# Patient Record
Sex: Female | Born: 1975 | Race: White | Hispanic: No | Marital: Single | Smoking: Former smoker
Health system: Southern US, Community
[De-identification: ages and names within clinical notes are randomized; demographics above are authoritative.]

## PROBLEM LIST (undated history)

## (undated) DIAGNOSIS — E669 Obesity, unspecified: Secondary | ICD-10-CM

## (undated) DIAGNOSIS — E785 Hyperlipidemia, unspecified: Secondary | ICD-10-CM

## (undated) DIAGNOSIS — Z8619 Personal history of other infectious and parasitic diseases: Secondary | ICD-10-CM

## (undated) HISTORY — DX: Obesity, unspecified: E66.9

## (undated) HISTORY — DX: Hyperlipidemia, unspecified: E78.5

## (undated) HISTORY — DX: Personal history of other infectious and parasitic diseases: Z86.19

---

## 2012-04-04 ENCOUNTER — Encounter: Payer: Self-pay | Admitting: Sports Medicine

## 2012-04-04 ENCOUNTER — Ambulatory Visit (INDEPENDENT_AMBULATORY_CARE_PROVIDER_SITE_OTHER): Payer: BC Managed Care – PPO | Admitting: Sports Medicine

## 2012-04-04 VITALS — BP 131/88 | HR 96 | Wt 222.0 lb

## 2012-04-04 DIAGNOSIS — E669 Obesity, unspecified: Secondary | ICD-10-CM

## 2012-04-04 DIAGNOSIS — Z299 Encounter for prophylactic measures, unspecified: Secondary | ICD-10-CM

## 2012-04-04 DIAGNOSIS — N979 Female infertility, unspecified: Secondary | ICD-10-CM | POA: Insufficient documentation

## 2012-04-04 DIAGNOSIS — Z3009 Encounter for other general counseling and advice on contraception: Secondary | ICD-10-CM

## 2012-04-04 DIAGNOSIS — Z23 Encounter for immunization: Secondary | ICD-10-CM

## 2012-04-04 DIAGNOSIS — Z0000 Encounter for general adult medical examination without abnormal findings: Secondary | ICD-10-CM | POA: Insufficient documentation

## 2012-04-04 MED ORDER — PHENTERMINE HCL 37.5 MG PO CAPS: 37.5000 mg | ORAL_CAPSULE | ORAL | Status: DC

## 2012-04-04 NOTE — Progress Notes (Signed)
Subjective:    CC: Establish care.   HPI:  Obesity: Has tried multiple methods of weight loss including dieting, and exercise. None of them worked, and she desires pharmacologic assistance.  Preventive measures: Needs some blood work done, Pap smears are done by her OB/GYN.  Trying to get pregnant: Has been trying for 2 years, but has not used the rhythm method.  Intercourse has been random.  Past medical history, Surgical history, Family history, Social history, Allergies, and medications have been entered into the medical record, reviewed, and no changes needed.   Review of Systems: No headache, visual changes, nausea, vomiting, diarrhea, constipation, dizziness, abdominal pain, skin rash, fevers, chills, night sweats, swollen lymph nodes, weight loss, chest pain, body aches, joint swelling, muscle aches, shortness of breath, mood changes, visual or auditory hallucinations.  Objective:    General: Well Developed, well nourished, and in no acute distress.  Neuro: Alert and oriented x3, extra-ocular muscles intact.  HEENT: Normocephalic, atraumatic, pupils equal round reactive to light, neck supple, no masses, no lymphadenopathy, thyroid nonpalpable.  Skin: Warm and dry, no rashes noted.  Cardiac: Regular rate and rhythm, no murmurs rubs or gallops.  Respiratory: Clear to auscultation bilaterally. Not using accessory muscles, speaking in full sentences.  Abdominal: Soft, nontender, nondistended, positive bowel sounds, no masses, no organomegaly.  Musculoskeletal: Shoulder, elbow, wrist, hip, knee, ankle stable, and with full range of motion.  Impression and Recommendations:    The patient was counselled, risk factors were discussed, anticipatory guidance given.

## 2012-04-04 NOTE — Assessment & Plan Note (Signed)
Discussed rhythm method, as well as some of the physiology of fertility. Her husband will try for one year, and if ineffective we can consider referral to fertility medicine.

## 2012-04-04 NOTE — Assessment & Plan Note (Signed)
Has failed dieting and exercising. We'll start phentermine, and the nutritionist referral. She will return monthly for weight checks and refills.

## 2012-04-04 NOTE — Assessment & Plan Note (Signed)
Pap smears are done by an OB/GYN.  checking routine blood work. Return to clinic on an as-needed basis.

## 2012-04-06 LAB — COMPREHENSIVE METABOLIC PANEL
Alkaline Phosphatase: 60 U/L (ref 39–117)
Creat: 0.85 mg/dL (ref 0.50–1.10)
Glucose, Bld: 77 mg/dL (ref 70–99)
Sodium: 139 mEq/L (ref 135–145)
Total Bilirubin: 0.7 mg/dL (ref 0.3–1.2)
Total Protein: 7.1 g/dL (ref 6.0–8.3)

## 2012-04-06 LAB — LIPID PANEL
Cholesterol: 251 mg/dL — ABNORMAL HIGH (ref 0–200)
HDL: 64 mg/dL (ref >39)
LDL Cholesterol: 165 mg/dL — ABNORMAL HIGH (ref 0–99)
Total CHOL/HDL Ratio: 3.9 Ratio
Triglycerides: 109 mg/dL (ref <150)
VLDL: 22 mg/dL (ref 0–40)

## 2012-04-06 LAB — CBC
HCT: 46.6 % — ABNORMAL HIGH (ref 36.0–46.0)
Hemoglobin: 16 g/dL — ABNORMAL HIGH (ref 12.0–15.0)
MCH: 32.2 pg (ref 26.0–34.0)
MCHC: 34.3 g/dL (ref 30.0–36.0)
MCV: 93.8 fL (ref 78.0–100.0)
Platelets: 239 10*3/uL (ref 150–400)
RBC: 4.97 MIL/uL (ref 3.87–5.11)
RDW: 13.6 % (ref 11.5–15.5)
WBC: 6.8 K/uL (ref 4.0–10.5)

## 2012-04-06 LAB — TSH: TSH: 1.379 u[IU]/mL (ref 0.350–4.500)

## 2012-04-06 LAB — COMPREHENSIVE METABOLIC PANEL WITH GFR
ALT: 20 U/L (ref 0–35)
AST: 23 U/L (ref 0–37)
Albumin: 4.7 g/dL (ref 3.5–5.2)
BUN: 14 mg/dL (ref 6–23)
CO2: 23 meq/L (ref 19–32)
Calcium: 9.5 mg/dL (ref 8.4–10.5)
Chloride: 105 meq/L (ref 96–112)
Potassium: 4.6 meq/L (ref 3.5–5.3)

## 2012-04-06 LAB — TESTOSTERONE, FREE, TOTAL, SHBG
Sex Hormone Binding: 46 nmol/L (ref 18–114)
Testosterone, Free: 5 pg/mL (ref 0.6–6.8)
Testosterone-% Free: 1.5 % (ref 0.4–2.4)
Testosterone: 34.23 ng/dL (ref 10–70)

## 2012-04-06 LAB — VITAMIN D 25 HYDROXY (VIT D DEFICIENCY, FRACTURES): Vit D, 25-Hydroxy: 37 ng/mL (ref 30–89)

## 2012-04-06 MED ORDER — ATORVASTATIN CALCIUM 40 MG PO TABS: 40.0000 mg | ORAL_TABLET | Freq: Every day | ORAL | Status: DC

## 2012-04-06 NOTE — Addendum Note (Signed)
Addended by: Monica Becton on: 04/06/2012 12:43 PM   Modules accepted: Orders

## 2012-04-12 ENCOUNTER — Encounter: Payer: BC Managed Care – PPO | Attending: Sports Medicine | Admitting: *Deleted

## 2012-04-12 ENCOUNTER — Encounter: Payer: Self-pay | Admitting: *Deleted

## 2012-04-12 VITALS — Ht 66.0 in | Wt 221.2 lb

## 2012-04-12 DIAGNOSIS — E669 Obesity, unspecified: Secondary | ICD-10-CM

## 2012-04-12 DIAGNOSIS — Z713 Dietary counseling and surveillance: Secondary | ICD-10-CM | POA: Insufficient documentation

## 2012-04-12 NOTE — Progress Notes (Signed)
  Medical Nutrition Therapy:  Appt start time: 0945 end time:  1045.  Assessment:  Primary concerns today: here with mother for assistance with obesity. Lives with fiancee, she shops for food, both prepare the meals. Just got laid off in September, worked 2 nd shift then. Enjoys camping in travel trailer on weekends and vacations. Enjoys fishing and swimming.  MEDICATIONS: see list   DIETARY INTAKE:  Usual eating pattern includes 3 meals and 1-2 snacks per day.  Everyday foods include good variety of all food groups.  Avoided foods include high calorie foods.    24-hr recall:  B (11 AM): Fiber One, raisin bran cereal with 1% milk OR fiber Bar and fresh fruit OR egg mcMuffin x 1 with coffee with Splenda and cream Snk ( AM): not usually  L (3-4 PM): salad with protein, Ranch OR chicken salad made with [place] yogurt on [employer] sandwich thins Snk ( PM): string cheese if gets hungry or fresh fruit or [place] yogurt D (9 PM): lean meat, starch, vegetable, water Snk ( PM): none Beverages: coffee, water, wine on occasion  Usual physical activity: walks dog twice a day around the house area.  Estimated energy needs: 1600 calories 180 g carbohydrates 120 g protein 44 g fat  Progress Towards Goal(s):  In progress.   Nutritional Diagnosis:  NI-1.5 Excessive energy intake As related to activity level.  As evidenced by BMI of 35.8.    Intervention:  Nutrition counseling for weight loss initiated. Discussed Carb Counting and reading food labels, and benefits of increased activity. Suggested she and her mother plan on a scheduled walking program that they both seem to enjoy.  Plan:  Aim for 3 Carb Choices per meal (45 grams) +/- 1 either way  Aim for 0-2 Carbs per snack if hungry  Consider reading food labels for Total Carbohydrate of foods Consider  increasing your activity level by walking the track or other locations for 15-30 minutes daily as tolerated   Handouts given during visit  include: Carb Counting and Food Label handouts Meal Plan Card  Monitoring/Evaluation:  Dietary intake, exercise, reading food labels, and body weight prn.

## 2012-04-12 NOTE — Patient Instructions (Signed)
Plan:  Aim for 3 Carb Choices per meal (45 grams) +/- 1 either way  Aim for 0-2 Carbs per snack if hungry  Consider reading food labels for Total Carbohydrate of foods Consider  increasing your activity level by walking the track or other locations for 15-30 minutes daily as tolerated

## 2012-04-30 ENCOUNTER — Ambulatory Visit (INDEPENDENT_AMBULATORY_CARE_PROVIDER_SITE_OTHER): Payer: BC Managed Care – PPO | Admitting: Sports Medicine

## 2012-04-30 ENCOUNTER — Encounter: Payer: Self-pay | Admitting: Sports Medicine

## 2012-04-30 VITALS — BP 123/81 | HR 90 | Wt 221.0 lb

## 2012-04-30 DIAGNOSIS — E669 Obesity, unspecified: Secondary | ICD-10-CM

## 2012-04-30 MED ORDER — PHENTERMINE HCL 37.5 MG PO CAPS: 37.5000 mg | ORAL_CAPSULE | ORAL | Status: DC

## 2012-04-30 NOTE — Progress Notes (Signed)
Subjective:    CC: Followup  HPI: Obesity: Has been to the nutritionist, is learning some new dietary habits but unfortunately did have some dietary indiscretions over the Super Bowl weekend. She did lose some weight, and has no adverse effects from the medication.  Past medical history, Surgical history, Family history not pertinant except as noted below, Social history, Allergies, and medications have been entered into the medical record, reviewed, and no changes needed.   Review of Systems: No fevers, chills, night sweats, weight loss, chest pain, or shortness of breath.   Objective:    General: Well Developed, well nourished, and in no acute distress.  Neuro: Alert and oriented x3, extra-ocular muscles intact, sensation grossly intact.  HEENT: Normocephalic, atraumatic, pupils equal round reactive to light, neck supple, no masses, no lymphadenopathy, thyroid nonpalpable.  Skin: Warm and dry, no rashes. Cardiac: Regular rate and rhythm, no murmurs rubs or gallops.  Respiratory: Clear to auscultation bilaterally. Not using accessory muscles, speaking in full sentences.  Impression and Recommendations:

## 2012-04-30 NOTE — Assessment & Plan Note (Signed)
There has been minimal weight loss. She is continuing to work on exercise and dietary strategies. Refill phentermine we will revisit this in approximately 5 weeks as she still has a week left on her old phentermine prescription.

## 2012-06-05 ENCOUNTER — Ambulatory Visit (INDEPENDENT_AMBULATORY_CARE_PROVIDER_SITE_OTHER): Payer: BC Managed Care – PPO | Admitting: Sports Medicine

## 2012-06-05 ENCOUNTER — Encounter: Payer: Self-pay | Admitting: Sports Medicine

## 2012-06-05 VITALS — BP 114/78 | HR 107 | Wt 223.0 lb

## 2012-06-05 DIAGNOSIS — F338 Other recurrent depressive disorders: Secondary | ICD-10-CM | POA: Insufficient documentation

## 2012-06-05 DIAGNOSIS — F39 Unspecified mood [affective] disorder: Secondary | ICD-10-CM

## 2012-06-05 DIAGNOSIS — E669 Obesity, unspecified: Secondary | ICD-10-CM

## 2012-06-05 DIAGNOSIS — E785 Hyperlipidemia, unspecified: Secondary | ICD-10-CM

## 2012-06-05 MED ORDER — TOPIRAMATE 50 MG PO TABS: ORAL_TABLET | ORAL | Status: DC

## 2012-06-05 MED ORDER — PHENTERMINE HCL 37.5 MG PO CAPS: 37.5000 mg | ORAL_CAPSULE | ORAL | Status: DC

## 2012-06-05 NOTE — Assessment & Plan Note (Signed)
Continue Lipitor, checking lipids.

## 2012-06-05 NOTE — Progress Notes (Signed)
Subjective:    CC:  followup  HPI: Obesity: Unfortunately has gained some weight on phentermine. She's not been exercising but does claim that she has been eating appropriately. She R. he saw the nutritionist and claims to be incorporating her recommendations. No adverse effects from the medicine.  Mood: Somewhat depressed mood without any other symptoms likely due to the weather. And she desires to avoid pharmacologic intervention at this time.  Preventive measure: Most recent Pap smear done 2 years ago.  Past medical history, Surgical history, Family history not pertinant except as noted below, Social history, Allergies, and medications have been entered into the medical record, reviewed, and no changes needed.   Review of Systems: No fevers, chills, night sweats, weight loss, chest pain, or shortness of breath.   Objective:    General: Well Developed, well nourished, and in no acute distress.  Neuro: Alert and oriented x3, extra-ocular muscles intact, sensation grossly intact.  HEENT: Normocephalic, atraumatic, pupils equal round reactive to light, neck supple, no masses, no lymphadenopathy, thyroid nonpalpable.  Skin: Warm and dry, no rashes. Cardiac: Regular rate and rhythm, no murmurs rubs or gallops.  Respiratory: Clear to auscultation bilaterally. Not using accessory muscles, speaking in full sentences. Impression and Recommendations:

## 2012-06-05 NOTE — Assessment & Plan Note (Signed)
Versus dysthymia, I do think that the weather change will help her out. If no better we can certainly consider SSRI treatment.

## 2012-06-05 NOTE — Assessment & Plan Note (Addendum)
Unfortunate she has not lost any weight. In an effort to help I am going to add Topamax 50 mg. I like to see her back in one month.

## 2012-06-16 LAB — LIPID PANEL
Cholesterol: 111 mg/dL (ref 0–200)
HDL: 48 mg/dL (ref >39)
LDL Cholesterol: 48 mg/dL (ref 0–99)
Total CHOL/HDL Ratio: 2.3 ratio
Triglycerides: 73 mg/dL (ref <150)
VLDL: 15 mg/dL (ref 0–40)

## 2012-06-16 LAB — COMPREHENSIVE METABOLIC PANEL WITH GFR
AST: 25 U/L (ref 0–37)
Alkaline Phosphatase: 61 U/L (ref 39–117)
Glucose, Bld: 70 mg/dL (ref 70–99)
Potassium: 4.1 meq/L (ref 3.5–5.3)
Sodium: 139 meq/L (ref 135–145)
Total Bilirubin: 0.6 mg/dL (ref 0.3–1.2)
Total Protein: 7 g/dL (ref 6.0–8.3)

## 2012-06-16 LAB — COMPREHENSIVE METABOLIC PANEL
ALT: 19 U/L (ref 0–35)
Albumin: 4.6 g/dL (ref 3.5–5.2)
BUN: 11 mg/dL (ref 6–23)
CO2: 23 mEq/L (ref 19–32)
Calcium: 9.1 mg/dL (ref 8.4–10.5)
Chloride: 110 mEq/L (ref 96–112)
Creat: 0.91 mg/dL (ref 0.50–1.10)

## 2012-07-06 ENCOUNTER — Encounter: Payer: Self-pay | Admitting: Sports Medicine

## 2012-07-06 ENCOUNTER — Ambulatory Visit (INDEPENDENT_AMBULATORY_CARE_PROVIDER_SITE_OTHER): Payer: BC Managed Care – PPO | Admitting: Sports Medicine

## 2012-07-06 VITALS — BP 118/90 | HR 101 | Wt 218.0 lb

## 2012-07-06 DIAGNOSIS — Z713 Dietary counseling and surveillance: Secondary | ICD-10-CM

## 2012-07-06 DIAGNOSIS — E669 Obesity, unspecified: Secondary | ICD-10-CM

## 2012-07-06 MED ORDER — TOPIRAMATE 50 MG PO TABS: 50.0000 mg | ORAL_TABLET | Freq: Every day | ORAL | Status: DC

## 2012-07-06 MED ORDER — PHENTERMINE HCL 37.5 MG PO CAPS: 37.5000 mg | ORAL_CAPSULE | ORAL | Status: DC

## 2012-07-06 NOTE — Assessment & Plan Note (Signed)
Good weight loss. Refilling medications. Return in one month.

## 2012-07-06 NOTE — Progress Notes (Signed)
  Subjective:    CC: Followup  HPI: Obesity: Has been on phentermine and topiramate. No adverse effects.  Past medical history, Surgical history, Family history not pertinant except as noted below, Social history, Allergies, and medications have been entered into the medical record, reviewed, and no changes needed.   Review of Systems: No fevers, chills, night sweats, weight loss, chest pain, or shortness of breath.   Objective:    General: Well Developed, well nourished, and in no acute distress.  Neuro: Alert and oriented x3, extra-ocular muscles intact, sensation grossly intact.  HEENT: Normocephalic, atraumatic, pupils equal round reactive to light, neck supple, no masses, no lymphadenopathy, thyroid nonpalpable.  Skin: Warm and dry, no rashes. Cardiac: Regular rate and rhythm, no murmurs rubs or gallops.  Respiratory: Clear to auscultation bilaterally. Not using accessory muscles, speaking in full sentences. Impression and Recommendations:

## 2012-07-19 ENCOUNTER — Telehealth: Payer: Self-pay | Admitting: *Deleted

## 2012-07-19 NOTE — Telephone Encounter (Signed)
Tried to obtained PA for pt's Phentermine and it was denied. I have informed pt.

## 2012-08-03 ENCOUNTER — Ambulatory Visit: Payer: BC Managed Care – PPO | Admitting: Sports Medicine

## 2013-05-13 ENCOUNTER — Ambulatory Visit (INDEPENDENT_AMBULATORY_CARE_PROVIDER_SITE_OTHER): Payer: BC Managed Care – PPO | Admitting: Sports Medicine

## 2013-05-13 ENCOUNTER — Encounter: Payer: Self-pay | Admitting: Sports Medicine

## 2013-05-13 VITALS — BP 127/86 | HR 81 | Ht 66.0 in | Wt 234.0 lb

## 2013-05-13 DIAGNOSIS — E785 Hyperlipidemia, unspecified: Secondary | ICD-10-CM

## 2013-05-13 DIAGNOSIS — E669 Obesity, unspecified: Secondary | ICD-10-CM

## 2013-05-13 DIAGNOSIS — Z299 Encounter for prophylactic measures, unspecified: Secondary | ICD-10-CM

## 2013-05-13 DIAGNOSIS — A6000 Herpesviral infection of urogenital system, unspecified: Secondary | ICD-10-CM

## 2013-05-13 DIAGNOSIS — Z0000 Encounter for general adult medical examination without abnormal findings: Secondary | ICD-10-CM

## 2013-05-13 MED ORDER — ACYCLOVIR 400 MG PO TABS: 400.0000 mg | ORAL_TABLET | Freq: Three times a day (TID) | ORAL | Status: DC

## 2013-05-13 MED ORDER — TOPIRAMATE 50 MG PO TABS: 50.0000 mg | ORAL_TABLET | Freq: Every day | ORAL | Status: DC

## 2013-05-13 MED ORDER — ATORVASTATIN CALCIUM 40 MG PO TABS: 40.0000 mg | ORAL_TABLET | Freq: Every day | ORAL | Status: DC

## 2013-05-13 MED ORDER — PHENTERMINE HCL 37.5 MG PO CAPS: 37.5000 mg | ORAL_CAPSULE | ORAL | Status: DC

## 2013-05-13 NOTE — Progress Notes (Signed)
  Subjective:    CC: Complete physical  HPI:  Preventive measure: Complete physical will be performed. She does need a referral to women's healthcare for cervical cancer screening, her current OB/GYN retired.  Obesity: Came off of phentermine due to cost, she has insurance now desires to restart.  Hyperlipidemia: Has been off of Lipitor for some time now, needs a refill note she has insurance.  Past medical history, Surgical history, Family history not pertinant except as noted below, Social history, Allergies, and medications have been entered into the medical record, reviewed, and no changes needed.   Review of Systems: No headache, visual changes, nausea, vomiting, diarrhea, constipation, dizziness, abdominal pain, skin rash, fevers, chills, night sweats, swollen lymph nodes, weight loss, chest pain, body aches, joint swelling, muscle aches, shortness of breath, mood changes, visual or auditory hallucinations.  Objective:    General: Well Developed, well nourished, and in no acute distress.  Neuro: Alert and oriented x3, extra-ocular muscles intact, sensation grossly intact.  HEENT: Normocephalic, atraumatic, pupils equal round reactive to light, neck supple, no masses, no lymphadenopathy, thyroid nonpalpable. Oropharynx, nasopharynx, external ear canals are unremarkable. Skin: Warm and dry, no rashes noted.  Cardiac: Regular rate and rhythm, no murmurs rubs or gallops.  Respiratory: Clear to auscultation bilaterally. Not using accessory muscles, speaking in full sentences.  Abdominal: Soft, nontender, nondistended, positive bowel sounds, no masses, no organomegaly.  Musculoskeletal: Shoulder, elbow, wrist, hip, knee, ankle stable, and with full range of motion.  Impression and Recommendations:    The patient was counselled, risk factors were discussed, anticipatory guidance given.

## 2013-05-13 NOTE — Addendum Note (Signed)
Addended by: Silverio Decamp on: 05/13/2013 02:36 PM   Modules accepted: Orders, Medications

## 2013-05-13 NOTE — Assessment & Plan Note (Signed)
Unable to afford Valtrex, switching to acyclovir.

## 2013-05-13 NOTE — Assessment & Plan Note (Signed)
Has insurance again, refilling Topamax and phentermine. Return monthly for weight checks.

## 2013-05-13 NOTE — Assessment & Plan Note (Signed)
Physical exam performed today. She is due for her Pap smear, I am going to refer her to women's health.

## 2013-05-13 NOTE — Assessment & Plan Note (Signed)
Refilling Lipitor. We can recheck this in about 3 months.

## 2013-06-11 ENCOUNTER — Ambulatory Visit (INDEPENDENT_AMBULATORY_CARE_PROVIDER_SITE_OTHER): Payer: BC Managed Care – PPO | Admitting: Sports Medicine

## 2013-06-11 VITALS — BP 107/75 | HR 96 | Ht 66.0 in | Wt 225.0 lb

## 2013-06-11 DIAGNOSIS — E669 Obesity, unspecified: Secondary | ICD-10-CM

## 2013-06-11 MED ORDER — TOPIRAMATE 50 MG PO TABS: 50.0000 mg | ORAL_TABLET | Freq: Every day | ORAL | Status: DC

## 2013-06-11 MED ORDER — PHENTERMINE HCL 37.5 MG PO CAPS: 37.5000 mg | ORAL_CAPSULE | ORAL | Status: DC

## 2013-06-11 NOTE — Assessment & Plan Note (Signed)
9 pound weight loss, refilling medications, emphasized the importance of avoiding carbohydrates. Return in one month for a weight check and refills.

## 2013-06-11 NOTE — Progress Notes (Signed)
  Subjective:    CC: Followup weight check  HPI: Obesity: 9 pound weight loss with phentermine and Topamax. No side effects, happy with results so far.  Past medical history, Surgical history, Family history not pertinant except as noted below, Social history, Allergies, and medications have been entered into the medical record, reviewed, and no changes needed.   Review of Systems: No fevers, chills, night sweats, weight loss, chest pain, or shortness of breath.   Objective:    General: Well Developed, well nourished, and in no acute distress.  Neuro: Alert and oriented x3, extra-ocular muscles intact, sensation grossly intact.  HEENT: Normocephalic, atraumatic, pupils equal round reactive to light, neck supple, no masses, no lymphadenopathy, thyroid nonpalpable.  Skin: Warm and dry, no rashes. Cardiac: Regular rate and rhythm, no murmurs rubs or gallops, no lower extremity edema.  Respiratory: Clear to auscultation bilaterally. Not using accessory muscles, speaking in full sentences. Impression and Recommendations:

## 2013-07-16 ENCOUNTER — Ambulatory Visit: Payer: BC Managed Care – PPO | Admitting: Sports Medicine

## 2016-01-12 LAB — HM MAMMOGRAPHY

## 2016-01-15 ENCOUNTER — Encounter: Payer: Self-pay | Admitting: Sports Medicine

## 2017-01-09 ENCOUNTER — Ambulatory Visit (INDEPENDENT_AMBULATORY_CARE_PROVIDER_SITE_OTHER): Payer: BLUE CROSS/BLUE SHIELD | Admitting: Sports Medicine

## 2017-01-09 ENCOUNTER — Encounter: Payer: Self-pay | Admitting: Sports Medicine

## 2017-01-09 DIAGNOSIS — Z0000 Encounter for general adult medical examination without abnormal findings: Secondary | ICD-10-CM

## 2017-01-09 DIAGNOSIS — E785 Hyperlipidemia, unspecified: Secondary | ICD-10-CM

## 2017-01-09 NOTE — Assessment & Plan Note (Signed)
Annual physical, referral to OB/GYN, HIV screening, routine blood work.

## 2017-01-09 NOTE — Assessment & Plan Note (Signed)
Rechecking lipids. 

## 2017-01-09 NOTE — Assessment & Plan Note (Signed)
Referral for bariatric surgery.

## 2017-01-09 NOTE — Progress Notes (Signed)
  Subjective:    CC: Reestablish care.   HPI:  Danielle Carroll returns, I haven't seen her in over 3 years, she's gained a lot of weight, she feels sluggish, she snores, feels very drowsy and tired through the day. She is also due for some preventive measures.  Past medical history:  Negative.  See flowsheet/record as well for more information.  Surgical history: Negative.  See flowsheet/record as well for more information.  Family history: Negative.  See flowsheet/record as well for more information.  Social history: Negative.  See flowsheet/record as well for more information.  Allergies, and medications have been entered into the medical record, reviewed, and no changes needed.    Review of Systems: No headache, visual changes, nausea, vomiting, diarrhea, constipation, dizziness, abdominal pain, skin rash, fevers, chills, night sweats, swollen lymph nodes, weight loss, chest pain, body aches, joint swelling, muscle aches, shortness of breath, mood changes, visual or auditory hallucinations.  Objective:    General: Well Developed, well nourished, and in no acute distress.  Neuro: Alert and oriented x3, extra-ocular muscles intact, sensation grossly intact. Cranial nerves II through XII are intact, motor, sensory, and coordinative functions are all intact. HEENT: Normocephalic, atraumatic, pupils equal round reactive to light, neck supple, no masses, no lymphadenopathy, thyroid nonpalpable. Oropharynx, nasopharynx, external ear canals are unremarkable. Skin: Warm and dry, no rashes noted.  Cardiac: Regular rate and rhythm, no murmurs rubs or gallops.  Respiratory: Clear to auscultation bilaterally. Not using accessory muscles, speaking in full sentences.  Abdominal: Soft, nontender, nondistended, positive bowel sounds, no masses, no organomegaly.  Musculoskeletal: Shoulder, elbow, wrist, hip, knee, ankle stable, and with full range of motion.  Impression and Recommendations:    The patient was  counselled, risk factors were discussed, anticipatory guidance given.  Annual physical exam Annual physical, referral to OB/GYN, HIV screening, routine blood work.  Morbid obesity [facility]) Referral for bariatric surgery.  Hyperlipidemia with target low density lipoprotein (LDL) cholesterol less than 100 mg/dL Rechecking lipids  ___________________________________________ Gwen Her. Dianah Field, M.D., ABFM., CAQSM. Primary Care and [place] Instructor of [place] of [facility] of Medicine

## 2017-01-11 LAB — CBC
HCT: 44.3 % (ref 35.0–45.0)
Hemoglobin: 14.5 g/dL (ref 11.7–15.5)
MCH: 29.8 pg (ref 27.0–33.0)
MCHC: 32.7 g/dL (ref 32.0–36.0)
MCV: 91.2 fL (ref 80.0–100.0)
MPV: 10.3 fL (ref 7.5–12.5)
Platelets: 234 10*3/uL (ref 140–400)
RBC: 4.86 10*6/uL (ref 3.80–5.10)
RDW: 12.5 % (ref 11.0–15.0)
WBC: 9.2 Thousand/uL (ref 3.8–10.8)

## 2017-01-11 LAB — COMPREHENSIVE METABOLIC PANEL WITH GFR
Albumin: 4.4 g/dL (ref 3.6–5.1)
Calcium: 9.2 mg/dL (ref 8.6–10.2)
Chloride: 105 mmol/L (ref 98–110)
Potassium: 4.3 mmol/L (ref 3.5–5.3)
Sodium: 137 mmol/L (ref 135–146)
Total Protein: 7.1 g/dL (ref 6.1–8.1)

## 2017-01-11 LAB — COMPREHENSIVE METABOLIC PANEL
AG Ratio: 1.6 (calc) (ref 1.0–2.5)
ALT: 14 U/L (ref 6–29)
AST: 17 U/L (ref 10–30)
Alkaline phosphatase (APISO): 59 U/L (ref 33–115)
BUN: 12 mg/dL (ref 7–25)
CO2: 23 mmol/L (ref 20–32)
Creat: 0.82 mg/dL (ref 0.50–1.10)
Globulin: 2.7 g/dL (calc) (ref 1.9–3.7)
Glucose, Bld: 85 mg/dL (ref 65–99)
Total Bilirubin: 0.5 mg/dL (ref 0.2–1.2)

## 2017-01-11 LAB — HEMOGLOBIN A1C
Hgb A1c MFr Bld: 4.9 % of total Hgb (ref <5.7)
Mean Plasma Glucose: 94 (calc)
eAG (mmol/L): 5.2 (calc)

## 2017-01-11 LAB — HIV ANTIBODY (ROUTINE TESTING W REFLEX): HIV 1&2 Ab, 4th Generation: NONREACTIVE

## 2017-01-11 LAB — LIPID PANEL W/REFLEX DIRECT LDL
Cholesterol: 181 mg/dL (ref <200)
HDL: 46 mg/dL — ABNORMAL LOW (ref >50)
LDL Cholesterol (Calc): 107 mg/dL (calc) — ABNORMAL HIGH
Non-HDL Cholesterol (Calc): 135 mg/dL (calc) — ABNORMAL HIGH (ref <130)
Total CHOL/HDL Ratio: 3.9 (calc) (ref <5.0)
Triglycerides: 161 mg/dL — ABNORMAL HIGH (ref <150)

## 2017-01-11 LAB — VITAMIN D 25 HYDROXY (VIT D DEFICIENCY, FRACTURES): Vit D, 25-Hydroxy: 35 ng/mL (ref 30–100)

## 2017-01-11 LAB — TSH: TSH: 1.78 mIU/L

## 2017-01-23 ENCOUNTER — Telehealth: Payer: Self-pay

## 2017-01-23 NOTE — Telephone Encounter (Signed)
Left message for pt to call office for appt

## 2017-01-24 LAB — HM MAMMOGRAPHY

## 2017-02-06 ENCOUNTER — Encounter: Payer: Self-pay | Admitting: Obstetrics & Gynecology

## 2017-02-06 ENCOUNTER — Ambulatory Visit (INDEPENDENT_AMBULATORY_CARE_PROVIDER_SITE_OTHER): Payer: BLUE CROSS/BLUE SHIELD | Admitting: Obstetrics & Gynecology

## 2017-02-06 VITALS — BP 130/87 | HR 89 | Resp 16 | Ht 66.0 in | Wt 160.0 lb

## 2017-02-06 DIAGNOSIS — Z124 Encounter for screening for malignant neoplasm of cervix: Secondary | ICD-10-CM

## 2017-02-06 DIAGNOSIS — Z01419 Encounter for gynecological examination (general) (routine) without abnormal findings: Secondary | ICD-10-CM

## 2017-02-06 DIAGNOSIS — Z1151 Encounter for screening for human papillomavirus (HPV): Secondary | ICD-10-CM | POA: Diagnosis not present

## 2017-02-06 NOTE — Progress Notes (Signed)
Subjective:     Danielle Carroll is a 41 y.o. female here for a routine exam.  Current complaints: none; does not use birth control but is okay if she becomes pregnant..     Gynecologic History Patient's last menstrual period was 01/20/2017. Contraception: none Last Pap: 4 years ago at another MD--nml per patient.  Last mammogram: 12/2016 at [facility]--pt says the follow up study was normal..   Obstetric History OB History  Gravida Para Term Preterm AB Living  0 0 0 0 0 0  SAB TAB Ectopic Multiple Live Births  0 0 0 0 0         The following portions of the patient's history were reviewed and updated as appropriate: allergies, current medications, past family history, past medical history, past social history, past surgical history and problem list.  Review of Systems Pertinent items noted in HPI and remainder of comprehensive ROS otherwise negative.    Objective:      Vitals:   02/06/17 1600  BP: 130/87  Pulse: 89  Resp: 16  Weight: 160 lb (72.6 kg)  Height: 5\' 6"  (1.676 m)   Vitals:  WNL General appearance: alert, cooperative and no distress  HEENT: Normocephalic, without obvious abnormality, atraumatic Eyes: negative Throat: lips, mucosa, and tongue normal; teeth and gums normal  Respiratory: Clear to auscultation bilaterally  CV: Regular rate and rhythm  Breasts:  Normal appearance, no masses or tenderness, no nipple retraction or dimpling  GI: Soft, non-tender; bowel sounds normal; no masses,  no organomegaly  GU: External Genitalia:  Tanner V, no lesion Urethra:  No prolapse   Vagina: Pink, normal rugae, no blood or discharge  Cervix: No CMT, no lesion  Uterus:  Normal size and contour, non tender  Adnexa: Normal, no masses, non tender  Musculoskeletal: No edema, redness or tenderness in the calves or thighs  Skin: No lesions or rash  Lymphatic: Axillary adenopathy: none     Psychiatric: Normal mood and behavior    Assessment:    Healthy  female exam.    Plan:   Pap with cotesting. F/U with Dr. Darene Lamer for routine health maintenance Pt advised to use sunscreen and not to use tanning beds.

## 2017-02-09 LAB — CYTOLOGY - PAP
Diagnosis: NEGATIVE
HPV: NOT DETECTED

## 2017-04-07 ENCOUNTER — Encounter: Payer: Self-pay | Admitting: Sports Medicine

## 2018-02-19 LAB — HM MAMMOGRAPHY

## 2018-03-15 ENCOUNTER — Encounter: Payer: Self-pay | Admitting: Sports Medicine

## 2019-02-25 LAB — HM MAMMOGRAPHY

## 2019-02-27 ENCOUNTER — Encounter: Payer: Self-pay | Admitting: Sports Medicine

## 2019-03-07 ENCOUNTER — Encounter: Payer: Self-pay | Admitting: Sports Medicine

## 2019-05-13 ENCOUNTER — Ambulatory Visit (INDEPENDENT_AMBULATORY_CARE_PROVIDER_SITE_OTHER): Payer: 59 | Admitting: Sports Medicine

## 2019-05-13 ENCOUNTER — Other Ambulatory Visit: Payer: Self-pay

## 2019-05-13 ENCOUNTER — Ambulatory Visit (INDEPENDENT_AMBULATORY_CARE_PROVIDER_SITE_OTHER): Payer: 59

## 2019-05-13 DIAGNOSIS — M25461 Effusion, right knee: Secondary | ICD-10-CM

## 2019-05-13 DIAGNOSIS — G5603 Carpal tunnel syndrome, bilateral upper limbs: Secondary | ICD-10-CM | POA: Diagnosis not present

## 2019-05-13 DIAGNOSIS — M170 Bilateral primary osteoarthritis of knee: Secondary | ICD-10-CM | POA: Insufficient documentation

## 2019-05-13 NOTE — Assessment & Plan Note (Signed)
For 4 months now this 44 year old female has had swelling and pain in her right knee, lateral joint line. Aspiration and injection, x-rays, return to see me in a month.

## 2019-05-13 NOTE — Assessment & Plan Note (Signed)
Symptoms have been present now for several months, she does a lot of data entry. She has a fairly benign exam, negative Tinel's and Phalen signs. We will start with bilateral nighttime splinting for a month followed by median nerve Hydro dissection if no better.

## 2019-05-13 NOTE — Progress Notes (Signed)
    Procedures performed today:    Procedure: Real-time Ultrasound Guided aspiration/injection of right knee Device: Samsung HS60  Verbal informed consent obtained.  Time-out conducted.  Noted no overlying erythema, induration, or other signs of local infection.  Skin prepped in a sterile fashion.  Local anesthesia: Topical Ethyl chloride.  With sterile technique and under real time ultrasound guidance:  Using 18-gauge needle and aspirated 15 cc of clear, straw-colored fluid, syringe switched and 1 cc Kenalog 40, 2 cc lidocaine, 2 cc bupivacaine injected easily Completed without difficulty  Pain immediately resolved suggesting accurate placement of the medication.  Advised to call if fevers/chills, erythema, induration, drainage, or persistent bleeding.  Images permanently stored and available for review in the ultrasound unit.  Impression: Technically successful ultrasound guided injection.  Independent interpretation of tests performed by another provider:   None.  Impression and Recommendations:    Effusion of right knee For 4 months now this 44 year old female has had swelling and pain in her right knee, lateral joint line. Aspiration and injection, x-rays, return to see me in a month.  Carpal tunnel syndrome, bilateral Symptoms have been present now for several months, she does a lot of data entry. She has a fairly benign exam, negative Tinel's and Phalen signs. We will start with bilateral nighttime splinting for a month followed by median nerve Hydro dissection if no better.    ___________________________________________ Gwen Her. Dianah Field, M.D., ABFM., CAQSM. Primary Care and [place] Instructor of [place] of [facility] of Medicine

## 2019-06-10 ENCOUNTER — Encounter: Payer: Self-pay | Admitting: Sports Medicine

## 2019-06-10 ENCOUNTER — Other Ambulatory Visit: Payer: Self-pay

## 2019-06-10 ENCOUNTER — Ambulatory Visit (INDEPENDENT_AMBULATORY_CARE_PROVIDER_SITE_OTHER): Payer: 59

## 2019-06-10 ENCOUNTER — Ambulatory Visit (INDEPENDENT_AMBULATORY_CARE_PROVIDER_SITE_OTHER): Payer: 59 | Admitting: Sports Medicine

## 2019-06-10 DIAGNOSIS — B029 Zoster without complications: Secondary | ICD-10-CM | POA: Insufficient documentation

## 2019-06-10 DIAGNOSIS — R29898 Other symptoms and signs involving the musculoskeletal system: Secondary | ICD-10-CM | POA: Diagnosis not present

## 2019-06-10 DIAGNOSIS — G5603 Carpal tunnel syndrome, bilateral upper limbs: Secondary | ICD-10-CM | POA: Diagnosis not present

## 2019-06-10 DIAGNOSIS — M51369 Other intervertebral disc degeneration, lumbar region without mention of lumbar back pain or lower extremity pain: Secondary | ICD-10-CM | POA: Insufficient documentation

## 2019-06-10 DIAGNOSIS — M25461 Effusion, right knee: Secondary | ICD-10-CM | POA: Diagnosis not present

## 2019-06-10 MED ORDER — VALACYCLOVIR HCL 1 G PO TABS: 1000.0000 mg | ORAL_TABLET | Freq: Two times a day (BID) | ORAL | 2 refills | Status: DC

## 2019-06-10 MED ORDER — GABAPENTIN 300 MG PO CAPS: ORAL_CAPSULE | ORAL | 3 refills | Status: DC

## 2019-06-10 NOTE — Assessment & Plan Note (Signed)
Amiah is having recurrence of shingles, right lower lumbar distribution. Adding Valtrex, gabapentin at night.

## 2019-06-10 NOTE — Assessment & Plan Note (Signed)
Still has some tingling in the fingertips but feels as though she can live with it, she is using her nighttime splints. If this becomes intolerable we can proceed with bilateral median nerve hydrodissection.

## 2019-06-10 NOTE — Assessment & Plan Note (Signed)
Now resolved after aspiration and injection, x-ray showed simple osteoarthritis.

## 2019-06-10 NOTE — Progress Notes (Signed)
    Procedures performed today:    None.  Independent interpretation of notes and tests performed by another provider:   None.  Impression and Recommendations:    Effusion of right knee Now resolved after aspiration and injection, x-ray showed simple osteoarthritis.  Shingles Ankitha is having recurrence of shingles, right lower lumbar distribution. Adding Valtrex, gabapentin at night.   Leg weakness, bilateral Has noted progressive lower extremity weakness over the past 6 months. Adding lumbar spine x-rays, MRI, as well as nerve conduction and EMG.  Carpal tunnel syndrome, bilateral Still has some tingling in the fingertips but feels as though she can live with it, she is using her nighttime splints. If this becomes intolerable we can proceed with bilateral median nerve hydrodissection.    ___________________________________________ Gwen Her. Dianah Field, M.D., ABFM., CAQSM. Primary Care and [place] Instructor of [place] of [facility] of Medicine

## 2019-06-10 NOTE — Assessment & Plan Note (Signed)
Has noted progressive lower extremity weakness over the past 6 months. Adding lumbar spine x-rays, MRI, as well as nerve conduction and EMG.

## 2019-06-22 ENCOUNTER — Other Ambulatory Visit: Payer: 59

## 2019-06-29 ENCOUNTER — Ambulatory Visit (INDEPENDENT_AMBULATORY_CARE_PROVIDER_SITE_OTHER): Payer: 59

## 2019-06-29 ENCOUNTER — Other Ambulatory Visit: Payer: Self-pay

## 2019-06-29 DIAGNOSIS — R29898 Other symptoms and signs involving the musculoskeletal system: Secondary | ICD-10-CM | POA: Diagnosis not present

## 2019-07-08 ENCOUNTER — Ambulatory Visit (INDEPENDENT_AMBULATORY_CARE_PROVIDER_SITE_OTHER): Payer: 59 | Admitting: Sports Medicine

## 2019-07-08 ENCOUNTER — Encounter: Payer: Self-pay | Admitting: Sports Medicine

## 2019-07-08 ENCOUNTER — Other Ambulatory Visit: Payer: Self-pay

## 2019-07-08 DIAGNOSIS — R29898 Other symptoms and signs involving the musculoskeletal system: Secondary | ICD-10-CM | POA: Diagnosis not present

## 2019-07-08 DIAGNOSIS — G5603 Carpal tunnel syndrome, bilateral upper limbs: Secondary | ICD-10-CM | POA: Diagnosis not present

## 2019-07-08 DIAGNOSIS — M25461 Effusion, right knee: Secondary | ICD-10-CM

## 2019-07-08 DIAGNOSIS — B029 Zoster without complications: Secondary | ICD-10-CM | POA: Diagnosis not present

## 2019-07-08 NOTE — Assessment & Plan Note (Signed)
Now completely resolved with Valtrex and gabapentin.

## 2019-07-08 NOTE — Assessment & Plan Note (Signed)
X-rays and MRI showed simple degenerative processes but nothing that would result in extremity weakness. She does have her nerve conduction and EMG scheduled, her back pain is minimal and she feels as though she can live with this as well.

## 2019-07-08 NOTE — Assessment & Plan Note (Signed)
At the last visit this had resolved after aspiration and injection, x-ray showed simple osteoarthritis. She is a bit of swelling and a bit of discomfort but overall she feels as though she can live with it.

## 2019-07-08 NOTE — Progress Notes (Signed)
    Procedures performed today:    None.  Independent interpretation of notes and tests performed by another provider:   None.  Brief History, Exam, Impression, and Recommendations:    Carpal tunnel syndrome, bilateral This pleasant 44 year old female returns, her tunnel syndrome has improved considerably to the point where she only has minimal paresthesias of the fingertips but feels as though she can live with it, no progressive weakness, she is not dropping things, no thenar atrophy, return as needed for this, neck step would be median nerve Hydro dissection, she has been wearing her nighttime splints.  Effusion of right knee At the last visit this had resolved after aspiration and injection, x-ray showed simple osteoarthritis. She is a bit of swelling and a bit of discomfort but overall she feels as though she can live with it.  Leg weakness, bilateral X-rays and MRI showed simple degenerative processes but nothing that would result in extremity weakness. She does have her nerve conduction and EMG scheduled, her back pain is minimal and she feels as though she can live with this as well.  Shingles Now completely resolved with Valtrex and gabapentin.    ___________________________________________ Gwen Her. Dianah Field, M.D., ABFM., CAQSM. Primary Care and [place] Instructor of [place] of [facility] of Medicine

## 2019-07-08 NOTE — Assessment & Plan Note (Signed)
This pleasant 44 year old female returns, her tunnel syndrome has improved considerably to the point where she only has minimal paresthesias of the fingertips but feels as though she can live with it, no progressive weakness, she is not dropping things, no thenar atrophy, return as needed for this, neck step would be median nerve Hydro dissection, she has been wearing her nighttime splints.

## 2019-07-09 ENCOUNTER — Ambulatory Visit (INDEPENDENT_AMBULATORY_CARE_PROVIDER_SITE_OTHER): Payer: 59 | Admitting: Neurology

## 2019-07-09 ENCOUNTER — Encounter: Payer: 59 | Admitting: Neurology

## 2019-07-09 DIAGNOSIS — Z0289 Encounter for other administrative examinations: Secondary | ICD-10-CM

## 2019-07-09 DIAGNOSIS — R200 Anesthesia of skin: Secondary | ICD-10-CM | POA: Diagnosis not present

## 2019-07-09 DIAGNOSIS — R29898 Other symptoms and signs involving the musculoskeletal system: Secondary | ICD-10-CM | POA: Diagnosis not present

## 2019-07-09 NOTE — Progress Notes (Signed)
Full Name: Danielle Carroll Gender: Female MRN #: [id number] Date of Birth: 04-24-1975    Visit Date: 07/09/2019 08:36 Age: 44 Years Examining Physician: Arlice Colt, MD  Referring Physician: Aundria Mems, MD  History: Danielle Carroll is a 44 year old woman referred for right greater than left leg weakness and numbness.  She also reports bilateral hand numbness.  On examination, she had decreased pinprick sensation in the toes up to mid shin.  She had decreased pinprick sensation at the fingertips, worse in the first 3 fingers than the fifth digit.  She had normal vibration sensation in the hands and feet.  Nerve conduction studies: Bilateral peroneal and tibial motor responses had normal distal latencies, amplitudes and conduction velocities.  Bilateral sural and superficial peroneal sensory responses had normal peak latencies with borderline normal amplitudes.  Electromyography: Needle EMG of selected muscles of the right leg and arm was performed.  In the right leg, there was mild chronic denervation noted in the gastrocnemius, abductor hallucis and extensor digitorum brevis muscles.  In the right arm, there was mild chronic denervation in the triceps brachii and flexor carpi ulnaris muscle.  Other muscles evaluated were normal.  There was no abnormal spontaneous activity.  Tibial F-wave responses were normal.  The sympathetic skin response was absent in the right foot and blunted in the right palm.  Impression:  This NCV/EMG study showed the following: 1.  There is no evidence of a large fiber polyneuropathy.  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 2.   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  Danielle Carroll A. Felecia Shelling, MD, PhD, FAAN Certified in Neurology, Clinical Neurophysiology, Sleep Medicine, Pain Medicine and Neuroimaging Director, [place] [facility] [address], [place], [place] [address] [phone]  Clinical note: If numbness in the feet worsens, consider referral for further evaluation for the possible small fiber polyneuropathy.  -- RAS       MNC    Nerve / Sites Muscle Latency Ref. Amplitude Ref. Rel Amp Segments Distance Velocity Ref. Area    ms ms mV mV %  cm m/s m/s mVms  R Peroneal - EDB     Ankle EDB 4.6 ?6.5 5.4 ?2.0 100 Ankle - EDB 9   17.9     Fib head EDB 10.2  4.2  78 Fib head - Ankle 27 49 ?44 14.7     Pop fossa EDB 12.1  4.7  110 Pop fossa - Fib head 10 52 ?44 16.8         Pop fossa - Ankle      L Peroneal - EDB     Ankle EDB 5.1 ?6.5 5.5 ?2.0 100 Ankle - EDB 9   17.0     Fib head EDB 11.1  5.2  95.2 Fib head - Ankle 28 47 ?44 17.0     Pop fossa EDB 12.9  5.2  100 Pop fossa - Fib head 10 55 ?44 17.2         Pop fossa - Ankle      R Tibial - AH     Ankle AH 4.4 ?5.8 7.9 ?4.0 100 Ankle - AH 9   24.6     Pop fossa AH 12.3  6.2  78.2 Pop fossa - Ankle 33 42 ?41 23.6  L Tibial - AH     Ankle AH 4.8 ?5.8 6.6 ?  4.0 100 Ankle - AH 9   22.4     Pop fossa AH 12.8  6.7  101 Pop fossa - Ankle 33 41 ?41 23.1             SSR        SNC    Nerve / Sites Rec. Site Peak Lat Ref.  Amp Ref. Segments Distance    ms ms V V  cm  R Sural - Ankle (Calf)     Calf Ankle 3.1 ?4.4 5 ?6 Calf - Ankle 14  L Sural - Ankle (Calf)     Calf Ankle 3.8 ?4.4 17 ?6 Calf - Ankle 14  R Superficial peroneal - Ankle     Lat leg Ankle 4.0 ?4.4 5 ?6 Lat leg - Ankle 14  L Superficial peroneal - Ankle     Lat leg Ankle 4.1 ?4.4 6 ?6 Lat leg - Ankle 14             F  Wave    Nerve F Lat Ref.   ms ms  R Tibial - AH 49.2 ?56.0  L Tibial - AH 51.8 ?56.0         EMG Summary Table    Spontaneous MUAP Recruitment  Muscle IA Fib PSW Fasc Other Amp Dur. Poly Pattern  R. Vastus medialis Normal None None None _______ Normal Normal Normal Normal  R. Tibialis anterior Normal None None None _______ Normal Normal  Normal Normal  R. Peroneus longus Normal None None None _______ Normal Normal Normal Normal  R. Gastrocnemius (Medial head) Normal None None None _______ Normal Normal 1+ Reduced  R. Abductor hallucis Normal None None None _______ Normal Normal 1+ Reduced  R. Extensor digitorum brevis Normal None None None _______ Normal Normal 1+ Reduced  R. Gluteus medius Normal None None None _______ Normal Normal Normal Normal  R. Iliopsoas Normal None None None _______ Normal Normal Normal Normal  R. First dorsal interosseous Normal None None None _______ Normal Normal Normal Normal  R. Abductor pollicis brevis Normal None None None _______ Normal Normal Normal Normal  R. Triceps brachii Normal None None None _______ Normal Normal 1+ Reduced  R. Deltoid Normal None None None _______ Normal Normal Normal Normal  R. Biceps brachii Normal None None None _______ Normal Normal Normal Normal  R. Flexor carpi ulnaris Normal None None None _______ Normal Normal 1+ Reduced

## 2019-09-18 ENCOUNTER — Other Ambulatory Visit: Payer: Self-pay

## 2019-09-18 DIAGNOSIS — B029 Zoster without complications: Secondary | ICD-10-CM

## 2019-09-18 MED ORDER — VALACYCLOVIR HCL 1 G PO TABS: 1000.0000 mg | ORAL_TABLET | Freq: Two times a day (BID) | ORAL | 2 refills | Status: DC

## 2019-09-18 MED ORDER — GABAPENTIN 300 MG PO CAPS: ORAL_CAPSULE | ORAL | 3 refills | Status: DC

## 2019-09-18 NOTE — Telephone Encounter (Signed)
Patient requesting Valacyclovir due to Shingles flair.

## 2020-02-25 ENCOUNTER — Other Ambulatory Visit: Payer: Self-pay | Admitting: Sports Medicine

## 2020-02-25 DIAGNOSIS — B029 Zoster without complications: Secondary | ICD-10-CM

## 2020-03-17 LAB — HM MAMMOGRAPHY

## 2020-04-02 ENCOUNTER — Ambulatory Visit (INDEPENDENT_AMBULATORY_CARE_PROVIDER_SITE_OTHER): Payer: 59

## 2020-04-02 ENCOUNTER — Other Ambulatory Visit: Payer: Self-pay

## 2020-04-02 ENCOUNTER — Ambulatory Visit (INDEPENDENT_AMBULATORY_CARE_PROVIDER_SITE_OTHER): Payer: 59 | Admitting: Sports Medicine

## 2020-04-02 DIAGNOSIS — R200 Anesthesia of skin: Secondary | ICD-10-CM

## 2020-04-02 DIAGNOSIS — M170 Bilateral primary osteoarthritis of knee: Secondary | ICD-10-CM

## 2020-04-02 DIAGNOSIS — M722 Plantar fascial fibromatosis: Secondary | ICD-10-CM | POA: Diagnosis not present

## 2020-04-02 NOTE — Assessment & Plan Note (Signed)
We will discuss this in further detail at the follow-up visit

## 2020-04-02 NOTE — Assessment & Plan Note (Signed)
Known bilateral knee osteoarthritis on x-rays, previous injection was in March of last year, repeat bilateral injections, return as needed.

## 2020-04-02 NOTE — Assessment & Plan Note (Signed)
Left-sided plantar fasciitis worse with the first 2 steps in the morning, starting with nighttime splinting, avoidance of barefoot walking, rehab exercises and referral for custom orthotics, return in a month, injection if no better.

## 2020-04-02 NOTE — Progress Notes (Signed)
    Procedures performed today:    Procedure: Real-time Ultrasound Guided injection of the left knee Device: Samsung HS60  Verbal informed consent obtained.  Time-out conducted.  Noted no overlying erythema, induration, or other signs of local infection.  Skin prepped in a sterile fashion.  Local anesthesia: Topical Ethyl chloride.  With sterile technique and under real time ultrasound guidance: 1 cc Kenalog 40, 2 cc lidocaine, 2 cc bupivacaine injected easily Completed without difficulty  Advised to call if fevers/chills, erythema, induration, drainage, or persistent bleeding.  Images permanently stored and available for review in PACS.  Impression: Technically successful ultrasound guided injection.  Procedure: Real-time Ultrasound Guided injection of the right knee Device: Samsung HS60  Verbal informed consent obtained.  Time-out conducted.  Noted no overlying erythema, induration, or other signs of local infection.  Skin prepped in a sterile fashion.  Local anesthesia: Topical Ethyl chloride.  With sterile technique and under real time ultrasound guidance: 1 cc Kenalog 40, 2 cc lidocaine, 2 cc bupivacaine injected easily Completed without difficulty  Advised to call if fevers/chills, erythema, induration, drainage, or persistent bleeding.  Images permanently stored and available for review in PACS.  Impression: Technically successful ultrasound guided injection.  Independent interpretation of notes and tests performed by another provider:   None.  Brief History, Exam, Impression, and Recommendations:    Primary osteoarthritis of both knees Known bilateral knee osteoarthritis on x-rays, previous injection was in March of last year, repeat bilateral injections, return as needed.  Plantar fasciitis, left Left-sided plantar fasciitis worse with the first 2 steps in the morning, starting with nighttime splinting, avoidance of barefoot walking, rehab exercises and referral for  custom orthotics, return in a month, injection if no better.  Numbness We will discuss this in further detail at the follow-up visit    ___________________________________________ Ihor Austin. Benjamin Stain, M.D., ABFM., CAQSM. Primary Care and Sports Medicine [facility]  Adjunct Instructor of Family Medicine  [employer]

## 2020-04-30 ENCOUNTER — Ambulatory Visit: Payer: 59 | Admitting: Sports Medicine

## 2020-04-30 ENCOUNTER — Other Ambulatory Visit: Payer: Self-pay

## 2020-04-30 ENCOUNTER — Ambulatory Visit (INDEPENDENT_AMBULATORY_CARE_PROVIDER_SITE_OTHER): Payer: 59

## 2020-04-30 DIAGNOSIS — M170 Bilateral primary osteoarthritis of knee: Secondary | ICD-10-CM

## 2020-04-30 DIAGNOSIS — M722 Plantar fascial fibromatosis: Secondary | ICD-10-CM | POA: Diagnosis not present

## 2020-04-30 NOTE — Progress Notes (Signed)
    Procedures performed today:    Procedure: Real-time Ultrasound Guided injection of the left plantar fascia origin Device: Samsung HS60  Verbal informed consent obtained.  Time-out conducted.  Noted no overlying erythema, induration, or other signs of local infection.  Skin prepped in a sterile fashion.  Local anesthesia: Topical Ethyl chloride.  With sterile technique and under real time ultrasound guidance:  Noted thickened plantar fascia, 1 cc Kenalog 40, 1 cc lidocaine, 1 cc bupivacaine injected easily Completed without difficulty  Advised to call if fevers/chills, erythema, induration, drainage, or persistent bleeding.  Images permanently stored and available for review in PACS.  Impression: Technically successful ultrasound guided injection.  Independent interpretation of notes and tests performed by another provider:   None.  Brief History, Exam, Impression, and Recommendations:    Plantar fasciitis, left Danielle Carroll returns, she is a pleasant 45 year old female, continued left heel pain consistent with plantar fasciitis, has not yet gotten Carroll orthotics, it sounds like we need to fill out some forms which she will get to me. Continue avoidance of barefoot walking, nighttime splinting, I did an injection today, return in a month.  Primary osteoarthritis of both knees Much better after bilateral injections.    ___________________________________________ Danielle Carroll. Danielle Carroll, M.D., ABFM., CAQSM. Primary Care and [place] Instructor of [place] of [facility] of Medicine

## 2020-04-30 NOTE — Assessment & Plan Note (Signed)
Much better after bilateral injections.

## 2020-04-30 NOTE — Assessment & Plan Note (Signed)
Danielle Carroll returns, she is a pleasant 45 year old female, continued left heel pain consistent with plantar fasciitis, has not yet gotten her orthotics, it sounds like we need to fill out some forms which she will get to me. Continue avoidance of barefoot walking, nighttime splinting, I did an injection today, return in a month.

## 2020-05-28 ENCOUNTER — Other Ambulatory Visit: Payer: Self-pay

## 2020-05-28 ENCOUNTER — Ambulatory Visit (INDEPENDENT_AMBULATORY_CARE_PROVIDER_SITE_OTHER): Payer: 59 | Admitting: Sports Medicine

## 2020-05-28 DIAGNOSIS — M722 Plantar fascial fibromatosis: Secondary | ICD-10-CM

## 2020-05-28 NOTE — Assessment & Plan Note (Signed)
Danielle Carroll returns, she is a very pleasant 45 year old female, she had persistent left heel pain at the last visit so we injected her, she has not yet gotten her custom orthotics but she is doing the nighttime splinting, and avoiding barefoot walking. She is 100% better. Return as needed.

## 2020-05-28 NOTE — Progress Notes (Signed)
    Procedures performed today:    None.  Independent interpretation of notes and tests performed by another provider:   None.  Brief History, Exam, Impression, and Recommendations:    Plantar fasciitis, left Danielle Carroll returns, she is a very pleasant 45 year old female, she had persistent left heel pain at the last visit so we injected her, she has not yet gotten her custom orthotics but she is doing the nighttime splinting, and avoiding barefoot walking. She is 100% better. Return as needed.    ___________________________________________ Gwen Her. Dianah Field, M.D., ABFM., CAQSM. Primary Care and [place] Instructor of [place] of [facility] of Medicine

## 2020-06-04 ENCOUNTER — Encounter: Payer: Self-pay | Admitting: Sports Medicine

## 2020-07-01 ENCOUNTER — Other Ambulatory Visit: Payer: Self-pay | Admitting: Sports Medicine

## 2020-07-01 DIAGNOSIS — B029 Zoster without complications: Secondary | ICD-10-CM

## 2020-10-23 ENCOUNTER — Other Ambulatory Visit: Payer: Self-pay | Admitting: Sports Medicine

## 2020-10-23 DIAGNOSIS — B029 Zoster without complications: Secondary | ICD-10-CM

## 2020-10-27 MED ORDER — VALACYCLOVIR HCL 1 G PO TABS: 1000.0000 mg | ORAL_TABLET | Freq: Every day | ORAL | 5 refills | Status: DC

## 2021-01-27 ENCOUNTER — Ambulatory Visit (INDEPENDENT_AMBULATORY_CARE_PROVIDER_SITE_OTHER): Payer: 59

## 2021-01-27 ENCOUNTER — Ambulatory Visit: Payer: 59 | Admitting: Sports Medicine

## 2021-01-27 ENCOUNTER — Other Ambulatory Visit: Payer: Self-pay

## 2021-01-27 DIAGNOSIS — M170 Bilateral primary osteoarthritis of knee: Secondary | ICD-10-CM

## 2021-01-27 NOTE — Progress Notes (Signed)
    Procedures performed today:    Procedure: Real-time Ultrasound Guided injection of the left knee Device: Samsung HS60  Verbal informed consent obtained.  Time-out conducted.  Noted no overlying erythema, induration, or other signs of local infection.  Skin prepped in a sterile fashion.  Local anesthesia: Topical Ethyl chloride.  With sterile technique and under real time ultrasound guidance: Trace effusion, 1 cc Kenalog 40, 2 cc lidocaine, 2 cc bupivacaine injected easily Completed without difficulty  Advised to call if fevers/chills, erythema, induration, drainage, or persistent bleeding.  Images permanently stored and available for review in PACS.  Impression: Technically successful ultrasound guided injection.   Procedure: Real-time Ultrasound Guided injection of the right knee Device: Samsung HS60  Verbal informed consent obtained.  Time-out conducted.  Noted no overlying erythema, induration, or other signs of local infection.  Skin prepped in a sterile fashion.  Local anesthesia: Topical Ethyl chloride.  With sterile technique and under real time ultrasound guidance: Trace effusion, 1 cc Kenalog 40, 2 cc lidocaine, 2 cc bupivacaine injected easily Completed without difficulty  Advised to call if fevers/chills, erythema, induration, drainage, or persistent bleeding.  Images permanently stored and available for review in PACS.  Impression: Technically successful ultrasound guided injection.  Independent interpretation of notes and tests performed by another provider:   None.  Brief History, Exam, Impression, and Recommendations:    Primary osteoarthritis of both knees Bilateral knee injections, last done in January. Chronic process, pharmacologic intervention.    ___________________________________________ Gwen Her. Dianah Field, M.D., ABFM., CAQSM. Primary Care and [place] Instructor of [place] of [employer] of Medicine

## 2021-01-27 NOTE — Assessment & Plan Note (Signed)
Bilateral knee injections, last done in January. Chronic process, pharmacologic intervention.

## 2021-02-22 ENCOUNTER — Other Ambulatory Visit: Payer: Self-pay | Admitting: Sports Medicine

## 2021-02-22 DIAGNOSIS — B029 Zoster without complications: Secondary | ICD-10-CM

## 2021-03-24 LAB — HM MAMMOGRAPHY

## 2021-03-26 ENCOUNTER — Telehealth: Payer: Self-pay

## 2021-03-26 NOTE — Telephone Encounter (Signed)
Received fax mammogram results from [place]. Per T, results negative/normal and repeat in one year. Patient aware.

## 2021-06-23 ENCOUNTER — Other Ambulatory Visit: Payer: Self-pay | Admitting: Sports Medicine

## 2021-06-23 DIAGNOSIS — B029 Zoster without complications: Secondary | ICD-10-CM

## 2021-11-25 ENCOUNTER — Encounter: Payer: 59 | Admitting: Sports Medicine

## 2021-12-02 ENCOUNTER — Encounter: Payer: Self-pay | Admitting: Sports Medicine

## 2021-12-02 ENCOUNTER — Ambulatory Visit (INDEPENDENT_AMBULATORY_CARE_PROVIDER_SITE_OTHER): Payer: 59

## 2021-12-02 ENCOUNTER — Other Ambulatory Visit: Payer: Self-pay | Admitting: Sports Medicine

## 2021-12-02 ENCOUNTER — Ambulatory Visit (INDEPENDENT_AMBULATORY_CARE_PROVIDER_SITE_OTHER): Payer: 59 | Admitting: Sports Medicine

## 2021-12-02 VITALS — BP 117/70 | HR 81 | Ht 66.0 in | Wt 245.0 lb

## 2021-12-02 DIAGNOSIS — Z0000 Encounter for general adult medical examination without abnormal findings: Secondary | ICD-10-CM | POA: Diagnosis not present

## 2021-12-02 DIAGNOSIS — Z124 Encounter for screening for malignant neoplasm of cervix: Secondary | ICD-10-CM

## 2021-12-02 DIAGNOSIS — D229 Melanocytic nevi, unspecified: Secondary | ICD-10-CM | POA: Diagnosis not present

## 2021-12-02 DIAGNOSIS — R0789 Other chest pain: Secondary | ICD-10-CM | POA: Diagnosis not present

## 2021-12-02 DIAGNOSIS — B029 Zoster without complications: Secondary | ICD-10-CM

## 2021-12-02 DIAGNOSIS — Z1211 Encounter for screening for malignant neoplasm of colon: Secondary | ICD-10-CM | POA: Diagnosis not present

## 2021-12-02 DIAGNOSIS — E785 Hyperlipidemia, unspecified: Secondary | ICD-10-CM

## 2021-12-02 MED ORDER — GABAPENTIN 300 MG PO CAPS: 300.0000 mg | ORAL_CAPSULE | Freq: Two times a day (BID) | ORAL | 3 refills | Status: DC

## 2021-12-02 MED ORDER — WEGOVY 0.25 MG/0.5ML ~~LOC~~ SOAJ: 0.2500 mg | SUBCUTANEOUS | 0 refills | Status: DC

## 2021-12-02 MED ORDER — VALACYCLOVIR HCL 1 G PO TABS: 1000.0000 mg | ORAL_TABLET | Freq: Every day | ORAL | 5 refills | Status: DC

## 2021-12-02 NOTE — Telephone Encounter (Signed)
Left msg for patient to call insurance to see which GLP1 may be covered under her plan.

## 2021-12-02 NOTE — Assessment & Plan Note (Signed)
Dark hyperpigmented nevus mid back, she will return for a punch biopsy.

## 2021-12-02 NOTE — Assessment & Plan Note (Signed)
Annual physical as well. Checking routine labs today, referral to OB/GYN for cervical cancer screening. Adding Cologuard for colon cancer screening. Declines flu shot.

## 2021-12-02 NOTE — Progress Notes (Addendum)
Subjective:    CC: Annual Physical Exam  HPI:  This patient is here for their annual physical  I reviewed the past medical history, family history, social history, surgical history, and allergies today and no changes were needed.  Please see the problem list section below in epic for further details.  Past Medical History: Past Medical History:  Diagnosis Date   History of shingles    Hyperlipidemia    Obesity    Past Surgical History: No past surgical history on file. Social History: Social History   Socioeconomic History   Marital status: Single    Spouse name: Not on file   Number of children: Not on file   Years of education: Not on file   Highest education level: Not on file  Occupational History   Not on file  Tobacco Use   Smoking status: Former   Smokeless tobacco: Never  Vaping Use   Vaping Use: Never used  Substance and Sexual Activity   Alcohol use: Yes    Comment: 1 large glass wine with dinner couple nights a week   Drug use: No   Sexual activity: Yes    Partners: Male    Birth control/protection: None  Other Topics Concern   Not on file  Social History Narrative   Not on file   Social Determinants of Health   Financial Resource Strain: Not on file  Food Insecurity: Not on file  Transportation Needs: Not on file  Physical Activity: Not on file  Stress: Not on file  Social Connections: Not on file   Family History: Family History  Problem Relation Age of Onset   Hyperlipidemia Mother    Hypertension Mother    Cancer Maternal Grandmother    Diabetes Maternal Grandmother    Stroke Maternal Grandmother    Diabetes Paternal Grandmother    Stroke Paternal Grandfather    Allergies: No Known Allergies Medications: See med rec.  Review of Systems: No headache, visual changes, nausea, vomiting, diarrhea, constipation, dizziness, abdominal pain, skin rash, fevers, chills, night sweats, swollen lymph nodes, weight loss, chest pain, body aches,  joint swelling, muscle aches, shortness of breath, mood changes, visual or auditory hallucinations.  Objective:    General: Well Developed, well nourished, and in no acute distress.  Neuro: Alert and oriented x3, extra-ocular muscles intact, sensation grossly intact. Cranial nerves II through XII are intact, motor, sensory, and coordinative functions are all intact. HEENT: Normocephalic, atraumatic, pupils equal round reactive to light, neck supple, no masses, no lymphadenopathy, thyroid nonpalpable. Oropharynx, nasopharynx, external ear canals are unremarkable. Skin: Warm and dry, no rashes noted.  1 cm hyperpigmented suspicious nevus mid back. Cardiac: Regular rate and rhythm, no murmurs rubs or gallops.  Respiratory: Clear to auscultation bilaterally. Not using accessory muscles, speaking in full sentences.  Abdominal: Soft, nontender, nondistended, positive bowel sounds, no masses, no organomegaly.  Musculoskeletal: Shoulder, elbow, wrist, hip, knee, ankle stable, and with full range of motion.  Impression and Recommendations:    The patient was counselled, risk factors were discussed, anticipatory guidance given.  Annual physical exam Annual physical as well. Checking routine labs today, referral to OB/GYN for cervical cancer screening. Adding Cologuard for colon cancer screening. Declines flu shot.  Morbid obesity ([place]) Patient will be part of a multidisciplinary weight loss program, he will add calorie counting. Adding a prescription for [facility]. If [place]gov[place] not covered we will consider getting her compounded semaglutide.  Suspicious nevus Dark hyperpigmented nevus mid back, she will return for  a punch biopsy.  Chest tightness Kyia does get occasional episodes of chest tightness when going up stairs, no chest pain currently. I think we should go and get her restratified with labs and a stress test. Adding a chest x-ray as well today.  Hyperlipidemia with target low density  lipoprotein (LDL) cholesterol less than 100 mg/dL Lipids are quite elevated, adding rosuvastatin 20, recheck fasting lipids 2 months.   ____________________________________________ Gwen Her. Dianah Field, M.D., ABFM., CAQSM., AME. Primary Care and Sports Medicine [facility]  Adjunct Professor of [place] of [facility] [employer] [facility] [employer] of Medicine  Risk manager

## 2021-12-02 NOTE — Assessment & Plan Note (Signed)
Danielle Carroll does get occasional episodes of chest tightness when going up stairs, no chest pain currently. I think we should go and get her restratified with labs and a stress test. Adding a chest x-ray as well today.

## 2021-12-02 NOTE — Assessment & Plan Note (Addendum)
Patient will be part of a multidisciplinary weight loss program, he will add calorie counting. Adding a prescription for [facility]. If [place]gov[place] not covered we will consider getting her compounded semaglutide.

## 2021-12-02 NOTE — Addendum Note (Signed)
Addended by: Silverio Decamp on: 12/02/2021 10:51 AM   Modules accepted: Orders

## 2021-12-03 LAB — CBC
HCT: 44.9 % (ref 35.0–45.0)
Hemoglobin: 14.9 g/dL (ref 11.7–15.5)
MCH: 32.7 pg (ref 27.0–33.0)
MCHC: 33.2 g/dL (ref 32.0–36.0)
MCV: 98.5 fL (ref 80.0–100.0)
MPV: 10.8 fL (ref 7.5–12.5)
Platelets: 213 10*3/uL (ref 140–400)
RBC: 4.56 10*6/uL (ref 3.80–5.10)
RDW: 12.2 % (ref 11.0–15.0)
WBC: 10.6 10*3/uL (ref 3.8–10.8)

## 2021-12-03 LAB — COMPLETE METABOLIC PANEL WITH GFR
AG Ratio: 2 (calc) (ref 1.0–2.5)
ALT: 14 U/L (ref 6–29)
AST: 18 U/L (ref 10–35)
Albumin: 4.6 g/dL (ref 3.6–5.1)
Alkaline phosphatase (APISO): 54 U/L (ref 31–125)
BUN: 14 mg/dL (ref 7–25)
CO2: 25 mmol/L (ref 20–32)
Calcium: 9.6 mg/dL (ref 8.6–10.2)
Chloride: 105 mmol/L (ref 98–110)
Creat: 0.89 mg/dL (ref 0.50–0.99)
Globulin: 2.3 g/dL (calc) (ref 1.9–3.7)
Glucose, Bld: 83 mg/dL (ref 65–99)
Potassium: 5.2 mmol/L (ref 3.5–5.3)
Sodium: 138 mmol/L (ref 135–146)
Total Bilirubin: 0.6 mg/dL (ref 0.2–1.2)
Total Protein: 6.9 g/dL (ref 6.1–8.1)
eGFR: 80 mL/min/{1.73_m2} (ref >=60)

## 2021-12-03 LAB — LIPID PANEL
Cholesterol: 237 mg/dL — ABNORMAL HIGH (ref <200)
HDL: 56 mg/dL (ref >=50)
LDL Cholesterol (Calc): 145 mg/dL (calc) — ABNORMAL HIGH
Non-HDL Cholesterol (Calc): 181 mg/dL (calc) — ABNORMAL HIGH (ref <130)
Total CHOL/HDL Ratio: 4.2 (calc) (ref <5.0)
Triglycerides: 214 mg/dL — ABNORMAL HIGH (ref <150)

## 2021-12-03 LAB — HEMOGLOBIN A1C
Hgb A1c MFr Bld: 4.7 % of total Hgb (ref <5.7)
Mean Plasma Glucose: 88 mg/dL
eAG (mmol/L): 4.9 mmol/L

## 2021-12-03 LAB — TSH: TSH: 1.64 mIU/L

## 2021-12-03 MED ORDER — ROSUVASTATIN CALCIUM 20 MG PO TABS: 20.0000 mg | ORAL_TABLET | Freq: Every day | ORAL | 3 refills | Status: DC

## 2021-12-03 NOTE — Addendum Note (Signed)
Addended by: Silverio Decamp on: 12/03/2021 10:35 AM   Modules accepted: Orders

## 2021-12-03 NOTE — Assessment & Plan Note (Signed)
Lipids are quite elevated, adding rosuvastatin 20, recheck fasting lipids 2 months.

## 2021-12-09 ENCOUNTER — Other Ambulatory Visit: Payer: Self-pay | Admitting: Sports Medicine

## 2021-12-09 ENCOUNTER — Ambulatory Visit: Payer: 59 | Admitting: Sports Medicine

## 2021-12-09 ENCOUNTER — Encounter: Payer: Self-pay | Admitting: Sports Medicine

## 2021-12-09 VITALS — BP 114/73 | HR 73 | Temp 97.9°F | Ht 66.0 in | Wt 245.0 lb

## 2021-12-09 DIAGNOSIS — D229 Melanocytic nevi, unspecified: Secondary | ICD-10-CM

## 2021-12-09 NOTE — Progress Notes (Signed)
    Procedures performed today:    Procedure:  72m punch biopsy mid back suspicious nevus. Risks, benefits, and alternatives explained and consent obtained. Time out conducted. Surface prepped with alcohol. 2cc lidocaine with epinephine infiltrated in a field block. Adequate anesthesia ensured. Area prepped and draped in a sterile fashion. Excision performed with: Using a 6 mm punch I took a full-thickness lesion, I then placed four 4-0 Ethilon suture to close the skin defect. Hemostasis achieved. Pt stable.  Independent interpretation of notes and tests performed by another provider:   None.  Brief History, Exam, Impression, and Recommendations:    Suspicious nevus Punch biopsy today of dark hyperpigmented nevus mid back. Suture placed, return in 2 weeks for suture removal.    ____________________________________________ TGwen Her TDianah Field M.D., ABFM., CAQSM., AME. Primary Care and Sports Medicine [facility] Adjunct Professor of [place]  FRisk manager

## 2021-12-09 NOTE — Addendum Note (Signed)
Addended by: Mertha Finders on: 12/09/2021 11:36 AM   Modules accepted: Orders

## 2021-12-09 NOTE — Patient Instructions (Signed)
Incision Care, Adult An incision is a surgical cut that is made through your skin. Most incisions are closed after a surgical procedure. Your incision may be closed with stitches (sutures), staples, skin glue, or adhesive strips. You may need to return to your health care provider to have sutures or staples removed. This may occur several days or several weeks after your surgery. Until then, the incision needs to be cared for properly to prevent infection. Follow instructions from your health care provider about how to care for your incision. Supplies needed: Soap, water, and a clean hand towel. Wound cleanser. A clean bandage (dressing), if needed. Cream or ointment, if told by your health care provider. Clean gauze. How to care for your incision Cleaning the incision Ask your health care provider how to clean the incision. This may include: Wearing medical gloves. Using mild soap and water, or wound cleanser. Using a clean gauze to pat the incision dry after cleaning it. Dressing changes Wash your hands with soap and water for at least 20 seconds before and after you change the dressing. If soap and water are not available, use hand sanitizer. Do not use disinfectants or antiseptics, such as rubbing alcohol, to clean open wounds unless told by your health care provider. Change your dressing as told by your health care provider. Leave sutures, staples, skin glue, or adhesive strips in place. These skin closures may need to stay in place for 2 weeks or longer. If adhesive strip edges start to loosen and curl up, you may trim the loose edges. Do not remove adhesive strips completely unless your health care provider tells you to do that. Apply cream or ointment. Do this only as told by your health care provider. Cover the incision with a clean dressing. Ask your health care provider when you can start to leave the incision uncovered. Checking for infection Check your incision area every day for  signs of infection. Check for: More redness, swelling, or pain. More fluid or blood. New warmth, hardness, or a rash that develops along the incision. Pus or a bad smell.  Follow these instructions at home Medicines Take over-the-counter and prescription medicines only as told by your health care provider. If you were prescribed an antibiotic medicine, cream, or ointment, take or apply it as told by your health care provider. Do not stop using the antibiotic even if your condition improves. Eating and drinking Eat a diet that includes protein, vitamin A, vitamin C, and other nutrient-rich foods to help the wound heal. Foods rich in protein include meat, fish, eggs, dairy, beans, nuts, and protein supplement drinks. Foods rich in vitamin A include carrots and dark green, leafy vegetables. Foods rich in vitamin C include citrus fruits, tomatoes, broccoli, and peppers. Drink enough fluid to keep your urine pale yellow. General instructions  Do not take baths, swim, use a hot tub, or do anything that would put the incision underwater until your health care provider approves. Ask your health care provider if you may take showers. You may only be allowed to take sponge baths. Limit movement around your incision to promote healing. Avoid straining, lifting, or exercising for the first 2 weeks after your procedure, or for as long as told by your health care provider. Return to your normal activities as told by your health care provider. Ask your health care provider what activities are safe for you. Do not scratch, pick, or scrub the incision. Keep it covered as told by your health care provider.   Protect your incision from the sun when you are outside for the first 6 months, or for as long as told by your health care provider. Cover up the scar area or apply sunscreen that has an SPF of at least 30. Do not use any products that contain nicotine or tobacco, such as cigarettes, e-cigarettes, and  chewing tobacco. These can delay incision healing. If you need help quitting, ask your health care provider. Keep all follow-up visits. This is important. Contact a health care provider if: You have any of these signs of infection: More redness, swelling, or pain around your incision. More fluid or blood coming from your incision. New warmth or hardness around your incision. Pus or a bad smell coming from your incision. A rash that develops along the incision. You feel nauseous or you vomit. You are dizzy. Your sutures, staples, skin glue, or adhesive strips come undone. Your wound gets bigger. You have a fever. Get help right away if: Your incision bleeds through the dressing and the bleeding does not stop with gentle pressure. The edges of your incision open up and separate. These symptoms may represent a serious problem that is an emergency. Do not wait to see if the symptoms will go away. Get medical help right away. Call your local emergency services (911 in the [place] Do not drive yourself to the hospital. Summary Follow instructions from your health care provider about how to care for your incision. Wash your hands with soap and water for at least 20 seconds before and after you change the dressing. If soap and water are not available, use hand sanitizer. Check your incision area every day for signs of infection. Keep all follow-up visits. This is important. This information is not intended to replace advice given to you by your health care provider. Make sure you discuss any questions you have with your health care provider. Document Revised: 06/15/2020 Document Reviewed: 06/15/2020 Elsevier Patient Education  2023 Elsevier Inc.  

## 2021-12-09 NOTE — Assessment & Plan Note (Signed)
Punch biopsy today of dark hyperpigmented nevus mid back. Suture placed, return in 2 weeks for suture removal.

## 2021-12-12 IMAGING — DX DG KNEE COMPLETE 4+V*R*
4 series · 4 of 4 positions shown · non-contrast
Comparison: None.

CLINICAL DATA: Right-sided knee pain

EXAM:
RIGHT KNEE - COMPLETE 4+ VIEW

[tunnel]
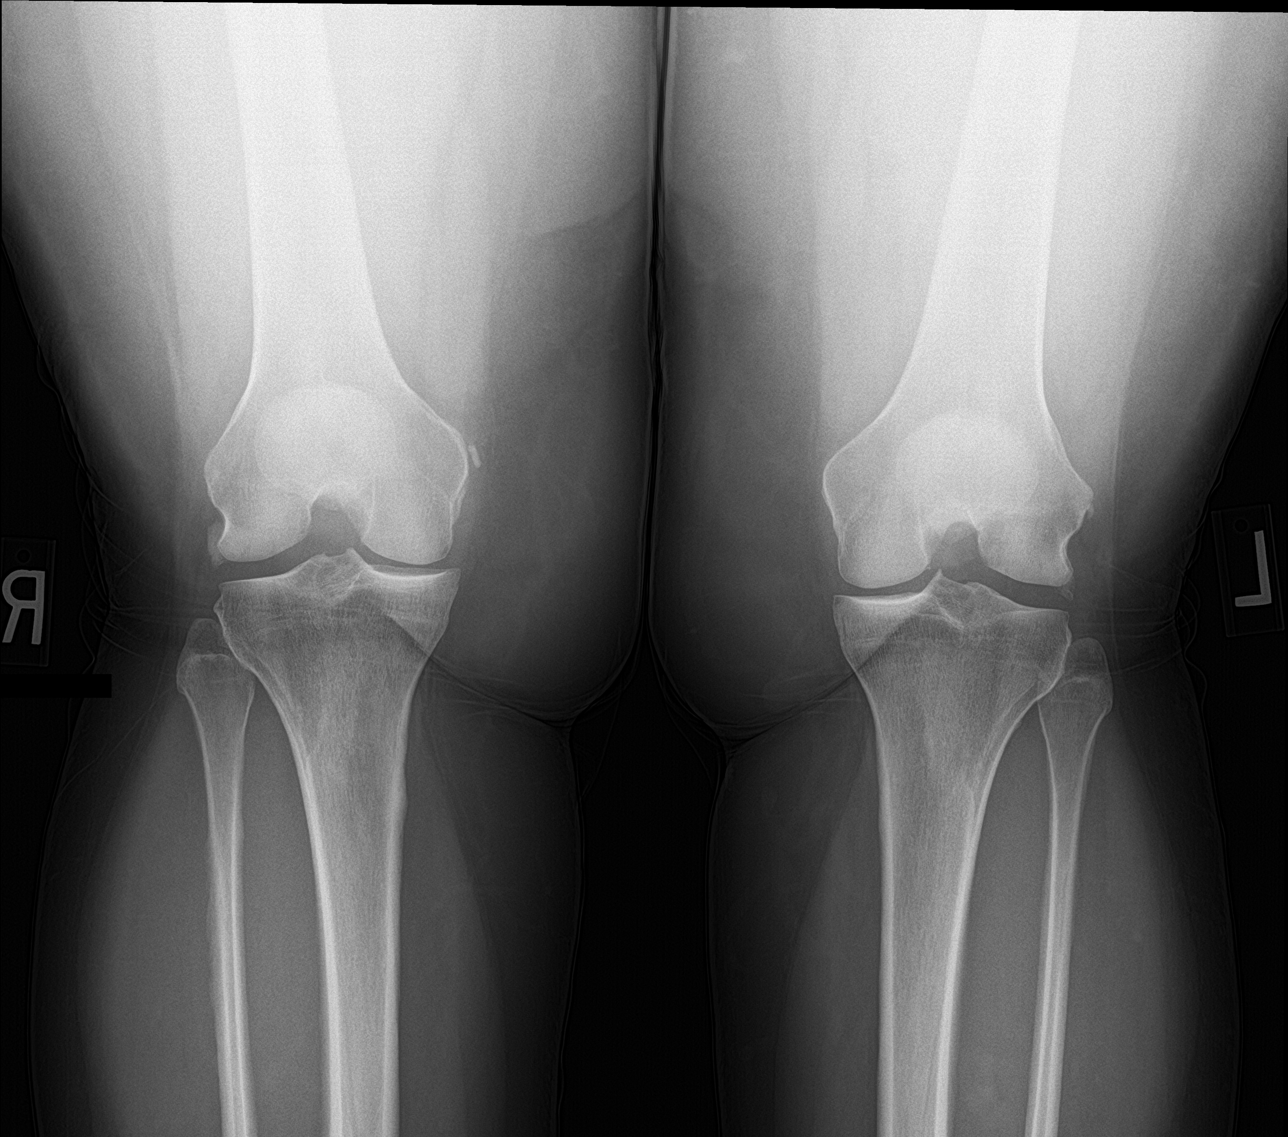

[knee lat]
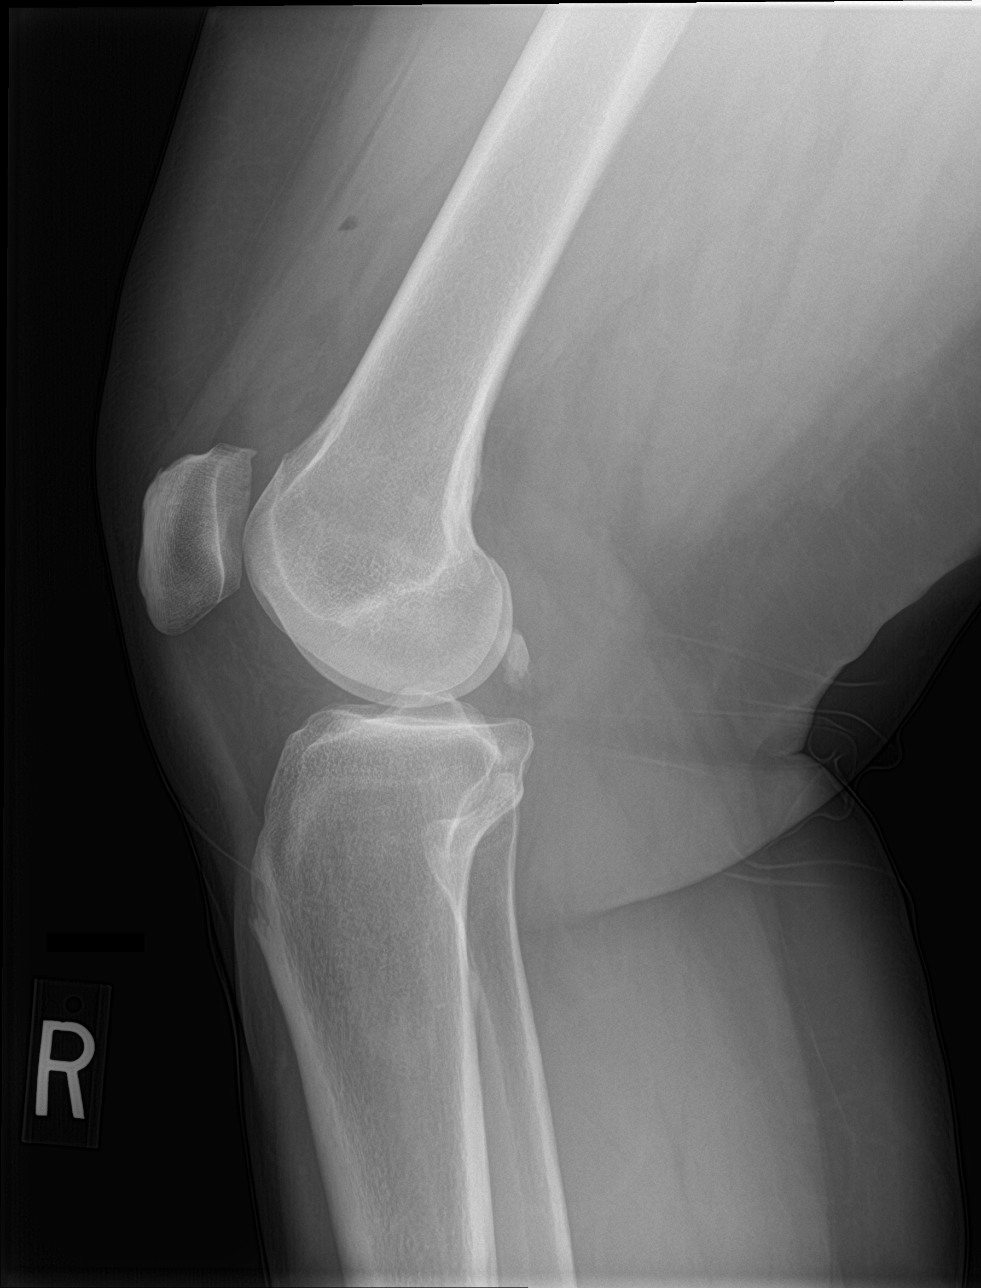

[knee sunrise]
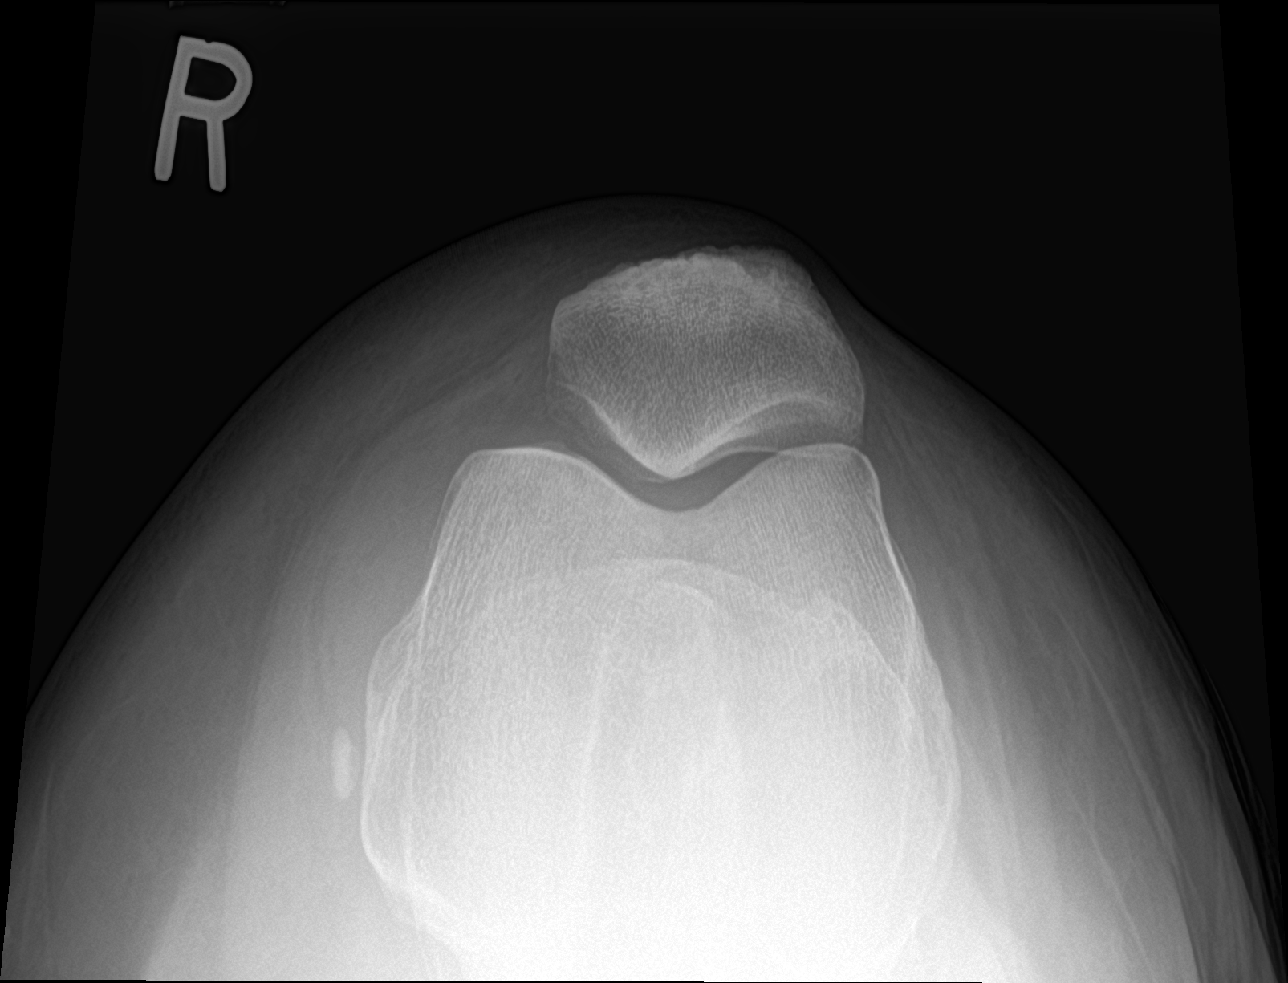

[knee ap bilat standing]
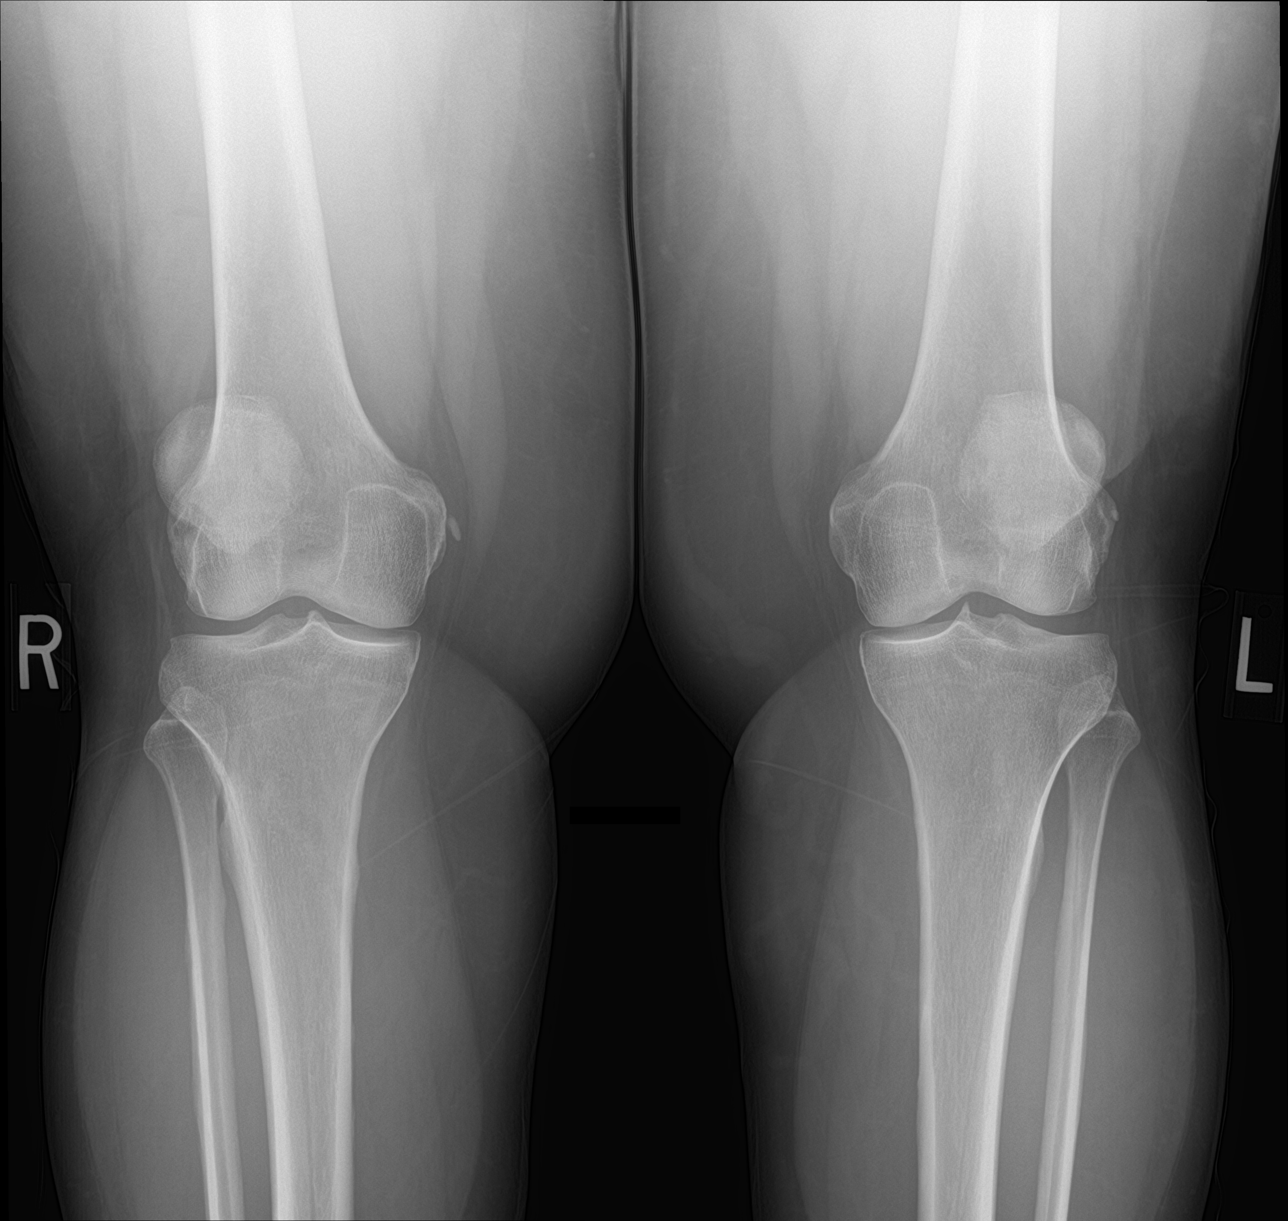

[4 of 4 positions shown; findings below may reference images not displayed]

FINDINGS: No fracture or malalignment. Mild medial joint space degenerative
change. Minimal patellofemoral spurring. Trace knee effusion. Tiny
gas in the joint space likely due to recent aspiration.
Calcification adjacent to the medial femoral condyle, possible prior
ligamentous injury.
IMPRESSION: Mild degenerative changes with small knee effusion.

## 2021-12-23 ENCOUNTER — Ambulatory Visit: Payer: 59 | Admitting: Sports Medicine

## 2021-12-23 DIAGNOSIS — D229 Melanocytic nevi, unspecified: Secondary | ICD-10-CM | POA: Diagnosis not present

## 2021-12-23 NOTE — Assessment & Plan Note (Signed)
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

## 2021-12-23 NOTE — Progress Notes (Signed)
    Procedures performed today:    None.  Independent interpretation of notes and tests performed by another provider:   None.  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    ____________________________________________ Gwen Her. Dianah Field, M.D., ABFM., CAQSM., AME. Primary Care and Sports Medicine  [facility]  Adjunct Professor of [place] [employer] [facility] of Medicine  Risk manager

## 2021-12-29 LAB — COLOGUARD: COLOGUARD: NEGATIVE

## 2021-12-31 ENCOUNTER — Telehealth: Payer: Self-pay | Admitting: Sports Medicine

## 2021-12-31 DIAGNOSIS — R0789 Other chest pain: Secondary | ICD-10-CM

## 2021-12-31 NOTE — Telephone Encounter (Signed)
Dr. Darene Lamer   Can you please change location on the ETT you order for [facility] they work their que please place for [address] so we can get patient scheduled.   Jenny Reichmann

## 2021-12-31 NOTE — Telephone Encounter (Signed)
Done

## 2022-01-10 ENCOUNTER — Encounter: Payer: Self-pay | Admitting: Obstetrics and Gynecology

## 2022-01-10 ENCOUNTER — Ambulatory Visit (INDEPENDENT_AMBULATORY_CARE_PROVIDER_SITE_OTHER): Payer: 59 | Admitting: Obstetrics and Gynecology

## 2022-01-10 ENCOUNTER — Other Ambulatory Visit (HOSPITAL_COMMUNITY)
Admission: RE | Admit: 2022-01-10 | Discharge: 2022-01-10 | Disposition: A | Discharge status: Home / Self Care | Payer: 59 | Source: Ambulatory Visit | Attending: Obstetrics and Gynecology | Admitting: Obstetrics and Gynecology

## 2022-01-10 VITALS — BP 130/86 | HR 71 | Ht 66.0 in | Wt 249.0 lb

## 2022-01-10 DIAGNOSIS — Z124 Encounter for screening for malignant neoplasm of cervix: Secondary | ICD-10-CM | POA: Insufficient documentation

## 2022-01-10 DIAGNOSIS — Z1231 Encounter for screening mammogram for malignant neoplasm of breast: Secondary | ICD-10-CM

## 2022-01-10 DIAGNOSIS — Z01419 Encounter for gynecological examination (general) (routine) without abnormal findings: Secondary | ICD-10-CM | POA: Diagnosis not present

## 2022-01-10 NOTE — Progress Notes (Signed)
GYNECOLOGY ANNUAL PREVENTATIVE CARE ENCOUNTER NOTE  Subjective:   Danielle Carroll is a 46 y.o. G0P0000 female here for a annual gynecologic exam. Current complaints: occasional vaginal odor, heavy periods first day.    Denies abnormal vaginal bleeding, discharge, pelvic pain, problems with intercourse or other gynecologic concerns. Declines STI screen.   Gynecologic History Patient's last menstrual period was 01/01/2022. Contraception: none Last Pap: 2018. Results: normal Last mammogram: 02/2021. Results: normal DEXA: has never had  Obstetric History OB History  Gravida Para Term Preterm AB Living  0 0 0 0 0 0  SAB IAB Ectopic Multiple Live Births  0 0 0 0 0    Past Medical History:  Diagnosis Date   History of shingles    Hyperlipidemia    Obesity     History reviewed. No pertinent surgical history.  Current Outpatient Medications on File Prior to Visit  Medication Sig Dispense Refill   acetaminophen (TYLENOL) 650 MG CR tablet Take 650 mg by mouth every 8 (eight) hours as needed for pain.     gabapentin (NEURONTIN) 300 MG capsule Take 1 capsule (300 mg total) by mouth 2 (two) times daily. 180 capsule 3   magnesium 30 MG tablet Take 30 mg by mouth 2 (two) times daily.     Omega-3 Fatty Acids (FISH OIL) 1000 MG CAPS Take by mouth.     rosuvastatin (CRESTOR) 20 MG tablet Take 1 tablet (20 mg total) by mouth daily. 90 tablet 3   valACYclovir (VALTREX) 1000 MG tablet Take 1 tablet (1,000 mg total) by mouth daily. 30 tablet 5   No current facility-administered medications on file prior to visit.    No Known Allergies  Social History   Socioeconomic History   Marital status: Single    Spouse name: Not on file   Number of children: Not on file   Years of education: Not on file   Highest education level: Not on file  Occupational History   Not on file  Tobacco Use   Smoking status: Former   Smokeless tobacco: Never  Vaping Use   Vaping Use: Never used   Substance and Sexual Activity   Alcohol use: Yes    Comment: 1 large glass wine with dinner couple nights a week   Drug use: No   Sexual activity: Yes    Partners: Male    Birth control/protection: None  Other Topics Concern   Not on file  Social History Narrative   Not on file   Social Determinants of Health   Financial Resource Strain: Not on file  Food Insecurity: Not on file  Transportation Needs: Not on file  Physical Activity: Not on file  Stress: Not on file  Social Connections: Not on file  Intimate Partner Violence: Not on file    Family History  Problem Relation Age of Onset   Hyperlipidemia Mother    Hypertension Mother    Cancer Maternal Grandmother    Diabetes Maternal Grandmother    Stroke Maternal Grandmother    Diabetes Paternal Grandmother    Stroke Paternal Grandfather      The following portions of the patient's history were reviewed and updated as appropriate: allergies, current medications, past family history, past medical history, past social history, past surgical history and problem list.  Review of Systems Pertinent items are noted in HPI.   Objective:  BP 130/86   Pulse 71   Ht '5\' 6"'$  (1.676 m)   Wt 249 lb (112.9 kg)  LMP 01/01/2022   BMI 40.19 kg/m  CONSTITUTIONAL: Well-developed, well-nourished female in no acute distress.  HENT:  Normocephalic, atraumatic, External right and left ear normal. Oropharynx is clear and moist EYES: Conjunctivae and EOM are normal. Pupils are equal, round, and reactive to light. No scleral icterus.  NECK: Normal range of motion, supple, no masses.  Normal thyroid.  SKIN: Skin is warm and dry. No rash noted. Not diaphoretic. No erythema. No pallor. NEUROLOGIC: Alert and oriented to person, place, and time. Normal reflexes, muscle tone coordination. No cranial nerve deficit noted. PSYCHIATRIC: Normal mood and affect. Normal behavior. Normal judgment and thought content. CARDIOVASCULAR: Normal heart rate  noted RESPIRATORY: Effort normal, no problems with respiration noted. BREASTS: Symmetric in size. No masses, skin changes, nipple drainage, or lymphadenopathy. ABDOMEN: Soft, no distention noted.  No tenderness, rebound or guarding.  PELVIC: Normal appearing external genitalia; normal appearing vaginal mucosa and cervix.  No abnormal discharge noted.  Pap smear obtained. Normal uterine size, no other palpable masses, no uterine or adnexal tenderness. MUSCULOSKELETAL: Normal range of motion. No tenderness.  No cyanosis, clubbing, or edema.   Exam done with chaperone present.   Assessment and Plan:   1. Well woman exam Healthy female exam Call to come in for self-swab with vaginal odor  2. Encounter for screening mammogram for malignant neoplasm of breast - MM Digital Screening; Future  3. Cervical cancer screening - Cytology - PAP( [facility])   Will follow up results of pap smear and manage accordingly. Encouraged improvement in diet and exercise.  COVID vaccine UTD Declines STI screen. Mammogram due in December Referral for colonoscopy n/a did cologuard Flu vaccine declined DEXA not due based on age  Routine preventative health maintenance measures emphasized. Please refer to After Visit Summary for other counseling recommendations.    Feliz Beam, MD, [place] for [facility])

## 2022-01-13 LAB — CYTOLOGY - PAP
Adequacy: ABSENT
Comment: NEGATIVE
Diagnosis: NEGATIVE
Diagnosis: REACTIVE
High risk HPV: NEGATIVE

## 2022-01-25 ENCOUNTER — Ambulatory Visit: Payer: 59 | Admitting: Sports Medicine

## 2022-01-25 ENCOUNTER — Ambulatory Visit (INDEPENDENT_AMBULATORY_CARE_PROVIDER_SITE_OTHER): Payer: 59

## 2022-01-25 DIAGNOSIS — E785 Hyperlipidemia, unspecified: Secondary | ICD-10-CM

## 2022-01-25 DIAGNOSIS — M170 Bilateral primary osteoarthritis of knee: Secondary | ICD-10-CM

## 2022-01-25 MED ORDER — PITAVASTATIN CALCIUM 2 MG PO TABS: 2.0000 mg | ORAL_TABLET | Freq: Every day | ORAL | 3 refills | Status: DC

## 2022-01-25 NOTE — Progress Notes (Signed)
    Procedures performed today:    Procedure: Real-time Ultrasound Guided injection of the right knee  device: Samsung HS60  Verbal informed consent obtained.  Time-out conducted.  Noted no overlying erythema, induration, or other signs of local infection.  Skin prepped in a sterile fashion.  Local anesthesia: Topical Ethyl chloride.  With sterile technique and under real time ultrasound guidance: No effusion noted, 1 cc Kenalog 40, 2 cc lidocaine, 2 cc bupivacaine injected easily Completed without difficulty  Advised to call if fevers/chills, erythema, induration, drainage, or persistent bleeding.  Images permanently stored and available for review in PACS.  Impression: Technically successful ultrasound guided injection.  Independent interpretation of notes and tests performed by another provider:   None.  Brief History, Exam, Impression, and Recommendations:    Primary osteoarthritis of both knees Pleasant 46 year old female, known bilateral knee osteoarthritis, last injection November 2022. Recurrence of pain but only in the right side, repeat injection today.   Hyperlipidemia with target low density lipoprotein (LDL) cholesterol less than 100 mg/dL Lipids elevated, we added rosuvastatin but unfortunately she developed intolerable myalgias, switching to Livalo.    ____________________________________________ Gwen Her. Dianah Field, M.D., ABFM., CAQSM., AME. Primary Care and Sports Medicine [facility] [facility]  Adjunct Professor of [place] of [facility] of Medicine  Risk manager

## 2022-01-25 NOTE — Assessment & Plan Note (Signed)
Lipids elevated, we added rosuvastatin but unfortunately she developed intolerable myalgias, switching to Livalo.

## 2022-01-25 NOTE — Assessment & Plan Note (Signed)
Pleasant 46 year old female, known bilateral knee osteoarthritis, last injection November 2022. Recurrence of pain but only in the right side, repeat injection today.

## 2022-03-30 ENCOUNTER — Ambulatory Visit (INDEPENDENT_AMBULATORY_CARE_PROVIDER_SITE_OTHER): Payer: 59

## 2022-03-30 DIAGNOSIS — Z1231 Encounter for screening mammogram for malignant neoplasm of breast: Secondary | ICD-10-CM

## 2022-04-20 ENCOUNTER — Ambulatory Visit: Payer: 59 | Attending: Sports Medicine

## 2022-04-20 DIAGNOSIS — R0789 Other chest pain: Secondary | ICD-10-CM

## 2022-04-20 LAB — EXERCISE TOLERANCE TEST
Angina Index: 0
Duke Treadmill Score: 7
Estimated workload: 8.5
Exercise duration (min): 7 min
Exercise duration (sec): 0 s
MPHR: 173 {beats}/min
Peak HR: 169 {beats}/min
Percent HR: 97 %
RPE: 17
Rest HR: 80 {beats}/min
ST Depression (mm): 0 mm

## 2022-05-28 ENCOUNTER — Other Ambulatory Visit: Payer: Self-pay | Admitting: Sports Medicine

## 2022-12-21 ENCOUNTER — Other Ambulatory Visit: Payer: Self-pay | Admitting: Sports Medicine

## 2022-12-21 DIAGNOSIS — B029 Zoster without complications: Secondary | ICD-10-CM

## 2023-01-18 ENCOUNTER — Telehealth: Payer: Self-pay | Admitting: General Practice

## 2023-01-18 NOTE — Transitions of Care (Post Inpatient/ED Visit) (Addendum)
   01/18/2023  Name: Danielle Carroll MRN: [id number] DOB: [birth year]  Today's TOC FU Call Status: Today's TOC FU Call Status:: Successful TOC FU Call Completed TOC FU Call Complete Date: 01/18/23 Patient's Name and Date of Birth confirmed.  Transition Care Management Follow-up Telephone Call Date of Discharge: 01/18/23 Discharge Facility: Other ([facility]) Name of Other ([facility]) Discharge Facility: [facility] Type of Discharge: Emergency Department Reason for ED Visit: Cardiac Conditions Cardiac Conditions Diagnosis: Chest Pain Persisting How have you been since you were released from the hospital?: Better Any questions or concerns?: No  Items Reviewed: Did you receive and understand the discharge instructions provided?: Yes Medications obtained,verified, and reconciled?: Yes (Medications Reviewed) Any new allergies since your discharge?: No Dietary orders reviewed?: NA Do you have support at home?: Yes  Medications Reviewed Today: Medications Reviewed Today     Reviewed by Modesto Charon, RN (Registered Nurse) on 01/18/23 at 1037  Med List Status: <None>   Medication Order Taking? Sig Documenting Provider Last Dose Status Informant  acetaminophen (TYLENOL) 650 MG CR tablet [id number] No Take 650 mg by mouth every 8 (eight) hours as needed for pain. [provider] Taking Active   gabapentin (NEURONTIN) 300 MG capsule [id number]  TAKE 1 CAPSULE BY MOUTH TWICE A DAY Monica Becton, MD  Active   magnesium 30 MG tablet [id number] No Take 30 mg by mouth 2 (two) times daily. [provider] Taking Active   Omega-3 Fatty Acids (FISH OIL) 1000 MG CAPS [id number] No Take by mouth. [provider] Taking Active   Pitavastatin Calcium 2 MG TABS [id number]  Take 1 tablet (2 mg total) by mouth daily. Monica Becton, MD  Active   valACYclovir (VALTREX) 1000 MG tablet [id number]  TAKE 1 TABLET BY MOUTH EVERY DAY Monica Becton, MD  Active              Home Care and Equipment/Supplies: Were Home Health Services Ordered?: NA Any new equipment or medical supplies ordered?: NA  Functional Questionnaire: Do you need assistance with bathing/showering or dressing?: No Do you need assistance with meal preparation?: No Do you need assistance with eating?: No Do you have difficulty maintaining continence: No Do you need assistance with getting out of bed/getting out of a chair/moving?: No Do you have difficulty managing or taking your medications?: No  Follow up appointments reviewed: PCP Follow-up appointment confirmed?: Yes Date of PCP follow-up appointment?: 01/19/23 Follow-up Provider: Dr. Karie Schwalbe Specialist [facility] Follow-up appointment confirmed?: NA Do you need transportation to your follow-up appointment?: No Do you understand care options if your condition(s) worsen?: Yes-patient verbalized understanding  SDOH Interventions Today    Flowsheet Row Most Recent Value  SDOH Interventions   Transportation Interventions Intervention Not Indicated       SIGNATURE Modesto Charon, RN BSN Nurse Health Advisor

## 2023-01-19 ENCOUNTER — Ambulatory Visit: Payer: 59 | Admitting: Sports Medicine

## 2023-01-19 ENCOUNTER — Other Ambulatory Visit (INDEPENDENT_AMBULATORY_CARE_PROVIDER_SITE_OTHER): Payer: 59

## 2023-01-19 ENCOUNTER — Encounter: Payer: Self-pay | Admitting: Sports Medicine

## 2023-01-19 DIAGNOSIS — E785 Hyperlipidemia, unspecified: Secondary | ICD-10-CM

## 2023-01-19 DIAGNOSIS — M170 Bilateral primary osteoarthritis of knee: Secondary | ICD-10-CM

## 2023-01-19 DIAGNOSIS — M5136 Other intervertebral disc degeneration, lumbar region with discogenic back pain only: Secondary | ICD-10-CM

## 2023-01-19 DIAGNOSIS — R0789 Other chest pain: Secondary | ICD-10-CM | POA: Diagnosis not present

## 2023-01-19 NOTE — Assessment & Plan Note (Signed)
Danielle Carroll did have an episode of chest pain last week, she woke up in the morning with left upper chest tightness. She then started to feel somewhat weak and nauseated, she developed some clamminess and flushing.  She then threw up. She went to the ED, ECG, cardiac enzymes all negative. She did have a stress test in January of this year that was negative. Symptoms resolved. I do suspect more of a viral gastritis type process. No further intervention needed.

## 2023-01-19 NOTE — Progress Notes (Signed)
    Procedures performed today:    Procedure: Real-time Ultrasound Guided injection of the left knee Device: Samsung HS60  Verbal informed consent obtained.  Time-out conducted.  Noted no overlying erythema, induration, or other signs of local infection.  Skin prepped in a sterile fashion.  Local anesthesia: Topical Ethyl chloride.  With sterile technique and under real time ultrasound guidance: Trace effusion noted, 1 cc Kenalog 40, 2 cc lidocaine, 2 cc bupivacaine injected easily Completed without difficulty  Advised to call if fevers/chills, erythema, induration, drainage, or persistent bleeding.  Images permanently stored and available for review in PACS.  Impression: Technically successful ultrasound guided injection.  Procedure: Real-time Ultrasound Guided injection of the right knee Device: Samsung HS60  Verbal informed consent obtained.  Time-out conducted.  Noted no overlying erythema, induration, or other signs of local infection.  Skin prepped in a sterile fashion.  Local anesthesia: Topical Ethyl chloride.  With sterile technique and under real time ultrasound guidance: Trace effusion noted, 1 cc Kenalog 40, 2 cc lidocaine, 2 cc bupivacaine injected easily Completed without difficulty  Advised to call if fevers/chills, erythema, induration, drainage, or persistent bleeding.  Images permanently stored and available for review in PACS.  Impression: Technically successful ultrasound guided injection.  Independent interpretation of notes and tests performed by another provider:   None.  Brief History, Exam, Impression, and Recommendations:    Chest tightness Danielle Carroll did have an episode of chest pain last week, she woke up in the morning with left upper chest tightness. She then started to feel somewhat weak and nauseated, she developed some clamminess and flushing.  She then threw up. She went to the ED, ECG, cardiac enzymes all negative. She did have a stress test  in January of this year that was negative. Symptoms resolved. I do suspect more of a viral gastritis type process. No further intervention needed.   Primary osteoarthritis of both knees Recurrence of pain, known osteoarthritis, last injection on the right side was October 2023, repeat injections but this time bilateral.  Lumbar degenerative disc disease Increasing right flank pain, known lumbar DDD. Adding additional home physical therapy, she will get some steroid effect from the bilateral knee injections today. She understands if she is consistent with therapy for 6 weeks she will likely get better but if not we can proceed with epidural intervention.  Hyperlipidemia with target low density lipoprotein (LDL) cholesterol less than 100 mg/dL Elevated lipids, intolerable myalgias with atorvastatin, rosuvastatin, Livalo. If lipids still elevated we may need to consider Repatha.    ____________________________________________ Danielle Carroll. Danielle Carroll, M.D., ABFM., CAQSM., AME. Primary Care and Sports Medicine [place] [facility]  Adjunct Professor of Family Medicine  [place] [employer] [facility] [employer]  Restaurant manager, fast food

## 2023-01-19 NOTE — Assessment & Plan Note (Signed)
Increasing right flank pain, known lumbar DDD. Adding additional home physical therapy, she will get some steroid effect from the bilateral knee injections today. She understands if she is consistent with therapy for 6 weeks she will likely get better but if not we can proceed with epidural intervention.

## 2023-01-19 NOTE — Assessment & Plan Note (Signed)
Elevated lipids, intolerable myalgias with atorvastatin, rosuvastatin, Livalo. If lipids still elevated we may need to consider Repatha.

## 2023-01-19 NOTE — Assessment & Plan Note (Signed)
Recurrence of pain, known osteoarthritis, last injection on the right side was October 2023, repeat injections but this time bilateral.

## 2023-01-20 DIAGNOSIS — M170 Bilateral primary osteoarthritis of knee: Secondary | ICD-10-CM | POA: Diagnosis not present

## 2023-01-20 LAB — CBC
Hematocrit: 43.5 % (ref 34.0–46.6)
Hemoglobin: 14 g/dL (ref 11.1–15.9)
MCH: 32.5 pg (ref 26.6–33.0)
MCHC: 32.2 g/dL (ref 31.5–35.7)
MCV: 101 fL — ABNORMAL HIGH (ref 79–97)
Platelets: 205 10*3/uL (ref 150–450)
RBC: 4.31 x10E6/uL (ref 3.77–5.28)
RDW: 12.6 % (ref 11.7–15.4)
WBC: 6.5 10*3/uL (ref 3.4–10.8)

## 2023-01-20 LAB — COMPREHENSIVE METABOLIC PANEL
ALT: 17 [IU]/L (ref 0–32)
AST: 16 [IU]/L (ref 0–40)
Albumin: 4.2 g/dL (ref 3.9–4.9)
Alkaline Phosphatase: 56 [IU]/L (ref 44–121)
BUN/Creatinine Ratio: 13 (ref 9–23)
BUN: 10 mg/dL (ref 6–24)
Bilirubin Total: 0.3 mg/dL (ref 0.0–1.2)
CO2: 18 mmol/L — ABNORMAL LOW (ref 20–29)
Calcium: 8.9 mg/dL (ref 8.7–10.2)
Chloride: 106 mmol/L (ref 96–106)
Creatinine, Ser: 0.75 mg/dL (ref 0.57–1.00)
Globulin, Total: 2.5 g/dL (ref 1.5–4.5)
Glucose: 79 mg/dL (ref 70–99)
Potassium: 4.1 mmol/L (ref 3.5–5.2)
Sodium: 140 mmol/L (ref 134–144)
Total Protein: 6.7 g/dL (ref 6.0–8.5)
eGFR: 98 mL/min/{1.73_m2} (ref >59)

## 2023-01-20 LAB — LIPID PANEL
Chol/HDL Ratio: 3.7 ratio (ref 0.0–4.4)
Cholesterol, Total: 199 mg/dL (ref 100–199)
HDL: 54 mg/dL (ref >39)
LDL Chol Calc (NIH): 123 mg/dL — ABNORMAL HIGH (ref 0–99)
Triglycerides: 124 mg/dL (ref 0–149)
VLDL Cholesterol Cal: 22 mg/dL (ref 5–40)

## 2023-01-20 LAB — HEMOGLOBIN A1C
Est. average glucose Bld gHb Est-mCnc: 97 mg/dL
Hgb A1c MFr Bld: 5 % (ref 4.8–5.6)

## 2023-01-20 LAB — TSH: TSH: 1.98 u[IU]/mL (ref 0.450–4.500)

## 2023-01-20 MED ORDER — TRIAMCINOLONE ACETONIDE 40 MG/ML IJ SUSP
80.0000 mg | Freq: Once | INTRAMUSCULAR | Status: AC
  Administered 2023-01-20: 80 mg via INTRAMUSCULAR

## 2023-01-20 NOTE — Addendum Note (Signed)
Addended by: Carren Rang A on: 01/20/2023 07:47 AM   Modules accepted: Orders

## 2023-02-06 ENCOUNTER — Other Ambulatory Visit: Payer: Self-pay | Admitting: Sports Medicine

## 2023-03-02 ENCOUNTER — Ambulatory Visit: Payer: 59 | Admitting: Sports Medicine

## 2023-03-31 ENCOUNTER — Other Ambulatory Visit: Payer: Self-pay | Admitting: Obstetrics and Gynecology

## 2023-03-31 DIAGNOSIS — Z1231 Encounter for screening mammogram for malignant neoplasm of breast: Secondary | ICD-10-CM

## 2023-05-17 ENCOUNTER — Ambulatory Visit: Payer: 59

## 2023-05-17 DIAGNOSIS — Z1231 Encounter for screening mammogram for malignant neoplasm of breast: Secondary | ICD-10-CM | POA: Diagnosis not present

## 2023-05-22 ENCOUNTER — Encounter: Payer: Self-pay | Admitting: Family Medicine

## 2023-08-15 ENCOUNTER — Other Ambulatory Visit: Payer: Self-pay | Admitting: Sports Medicine

## 2023-11-28 ENCOUNTER — Encounter: Payer: Self-pay | Admitting: Sports Medicine

## 2024-04-10 ENCOUNTER — Other Ambulatory Visit (HOSPITAL_BASED_OUTPATIENT_CLINIC_OR_DEPARTMENT_OTHER): Payer: Self-pay | Admitting: Obstetrics & Gynecology

## 2024-04-10 DIAGNOSIS — Z1231 Encounter for screening mammogram for malignant neoplasm of breast: Secondary | ICD-10-CM

## 2024-05-27 ENCOUNTER — Ambulatory Visit (HOSPITAL_BASED_OUTPATIENT_CLINIC_OR_DEPARTMENT_OTHER)
